# Patient Record
Sex: Female | Born: 1990 | Race: White | Hispanic: No | Marital: Married | Smoking: Never smoker
Health system: Southern US, Community
[De-identification: ages and names within clinical notes are randomized; demographics above are authoritative.]

## PROBLEM LIST (undated history)

## (undated) ENCOUNTER — Inpatient Hospital Stay (HOSPITAL_COMMUNITY): Payer: Self-pay

## (undated) DIAGNOSIS — K649 Unspecified hemorrhoids: Secondary | ICD-10-CM

## (undated) DIAGNOSIS — R112 Nausea with vomiting, unspecified: Secondary | ICD-10-CM

## (undated) DIAGNOSIS — D649 Anemia, unspecified: Secondary | ICD-10-CM

## (undated) DIAGNOSIS — N390 Urinary tract infection, site not specified: Secondary | ICD-10-CM

## (undated) DIAGNOSIS — Z9889 Other specified postprocedural states: Secondary | ICD-10-CM

## (undated) HISTORY — PX: APPENDECTOMY: SHX54

## (undated) HISTORY — PX: TONSILLECTOMY: SUR1361

## (undated) HISTORY — DX: Anemia, unspecified: D64.9

## (undated) HISTORY — PX: BREAST SURGERY: SHX581

## (undated) HISTORY — PX: WISDOM TOOTH EXTRACTION: SHX21

---

## 2013-02-22 ENCOUNTER — Emergency Department (HOSPITAL_COMMUNITY)
Admission: EM | Admit: 2013-02-22 | Discharge: 2013-02-22 | Disposition: A | Discharge status: Home / Self Care | Payer: BC Managed Care – PPO | Attending: Emergency Medicine | Admitting: Emergency Medicine

## 2013-02-22 ENCOUNTER — Encounter (HOSPITAL_COMMUNITY): Payer: Self-pay | Admitting: *Deleted

## 2013-02-22 DIAGNOSIS — R197 Diarrhea, unspecified: Secondary | ICD-10-CM

## 2013-02-22 DIAGNOSIS — Z3202 Encounter for pregnancy test, result negative: Secondary | ICD-10-CM | POA: Insufficient documentation

## 2013-02-22 DIAGNOSIS — R110 Nausea: Secondary | ICD-10-CM | POA: Insufficient documentation

## 2013-02-22 DIAGNOSIS — K6289 Other specified diseases of anus and rectum: Secondary | ICD-10-CM | POA: Insufficient documentation

## 2013-02-22 DIAGNOSIS — K625 Hemorrhage of anus and rectum: Secondary | ICD-10-CM

## 2013-02-22 DIAGNOSIS — Z8679 Personal history of other diseases of the circulatory system: Secondary | ICD-10-CM | POA: Insufficient documentation

## 2013-02-22 LAB — COMPREHENSIVE METABOLIC PANEL
ALT: 8 U/L (ref 0–35)
Alkaline Phosphatase: 53 U/L (ref 39–117)
BUN: 11 mg/dL (ref 6–23)
CO2: 26 mEq/L (ref 19–32)
Chloride: 104 mEq/L (ref 96–112)
GFR calc Af Amer: >90 mL/min (ref >90)
GFR calc non Af Amer: >90 mL/min (ref >90)
Glucose, Bld: 113 mg/dL — ABNORMAL HIGH (ref 70–99)
Potassium: 3.9 mEq/L (ref 3.5–5.1)
Sodium: 137 mEq/L (ref 135–145)
Total Bilirubin: 0.3 mg/dL (ref 0.3–1.2)

## 2013-02-22 LAB — CBC WITH DIFFERENTIAL/PLATELET
Eosinophils Absolute: 0.1 10*3/uL (ref 0.0–0.7)
Hemoglobin: 12.2 g/dL (ref 12.0–15.0)
Lymphocytes Relative: 23 % (ref 12–46)
Lymphs Abs: 2 10*3/uL (ref 0.7–4.0)
MCH: 29.2 pg (ref 26.0–34.0)
Monocytes Relative: 8 % (ref 3–12)
Neutrophils Relative %: 68 % (ref 43–77)
Platelets: 239 10*3/uL (ref 150–400)
RBC: 4.18 MIL/uL (ref 3.87–5.11)
WBC: 9.1 10*3/uL (ref 4.0–10.5)

## 2013-02-22 LAB — URINALYSIS, ROUTINE W REFLEX MICROSCOPIC
Bilirubin Urine: NEGATIVE
Ketones, ur: NEGATIVE mg/dL
Leukocytes, UA: NEGATIVE
Nitrite: NEGATIVE
Protein, ur: NEGATIVE mg/dL
Urobilinogen, UA: 0.2 mg/dL (ref 0.0–1.0)
pH: 6.5 (ref 5.0–8.0)

## 2013-02-22 LAB — OCCULT BLOOD, POC DEVICE: Fecal Occult Bld: POSITIVE — AB

## 2013-02-22 MED ORDER — ONDANSETRON 4 MG PO TBDP
8.0000 mg | ORAL_TABLET | Freq: Once | ORAL | Status: AC
  Administered 2013-02-22: 8 mg via ORAL
  Filled 2013-02-22: qty 2

## 2013-02-22 MED ORDER — HYDROCODONE-ACETAMINOPHEN 5-325 MG PO TABS: 1.0000 | ORAL_TABLET | ORAL | Status: DC | PRN

## 2013-02-22 MED ORDER — ONDANSETRON HCL 8 MG PO TABS: 8.0000 mg | ORAL_TABLET | Freq: Three times a day (TID) | ORAL | Status: DC | PRN

## 2013-02-22 MED ORDER — HYDROCODONE-ACETAMINOPHEN 5-325 MG PO TABS
2.0000 | ORAL_TABLET | Freq: Once | ORAL | Status: AC
  Administered 2013-02-22: 2 via ORAL
  Filled 2013-02-22: qty 2

## 2013-02-22 NOTE — ED Provider Notes (Signed)
History    CSN: [id number] Arrival date & time 02/22/13  1935  None    Chief Complaint  Patient presents with  . Abdominal Pain   (Consider location/radiation/quality/duration/timing/severity/associated sxs/prior Treatment) HPI History provided by pt.   Pt developed diarrhea at 10pm yesterday.  Has had several episodes since.  Noticed bleeding from rectum the second or third episode and has seen large amounts of blood every time since.  Associated w/ severe cramping across lower abd that increases during bowel movement but is relieved briefly immediately following, as well as mild rectal pain and nausea.  Denies fever, lightheadedness, SOB, urinary and vaginal sx.  Pt prescribed cipro/flagyl and an urgent care today and has taken the first dose.  Has h/o hemorrhoids but is otherwise healthy.  No prior h/o hematochezia.  No recent travel or abx use.  Has not eaten at a restaurant recently.  No known sick contacts.  Is not anti-coagulated.  Paternal grandmother w/ breast cancer but no FH of colon cancer.  Denies trauma.    History reviewed. No pertinent past medical history. History reviewed. No pertinent past surgical history. No family history on file. History  Substance Use Topics  . Smoking status: Never Smoker   . Smokeless tobacco: Not on file  . Alcohol Use: No   OB History   Grav Para Term Preterm Abortions TAB SAB Ect Mult Living                 Review of Systems  All other systems reviewed and are negative.    Allergies  Review of patient's allergies indicates no known allergies.  Home Medications   Current Outpatient Rx  Name  Route  Sig  Dispense  Refill  . ciprofloxacin (CIPRO) 500 MG tablet   Oral   Take 500 mg by mouth 2 (two) times daily.         . norethindrone-ethinyl estradiol-iron (MICROGESTIN FE,GILDESS FE,LOESTRIN FE) 1.5-30 MG-MCG tablet   Oral   Take 1 tablet by mouth every evening.         Marland Kitchen HYDROcodone-acetaminophen (NORCO/VICODIN) 5-325  MG per tablet   Oral   Take 1 tablet by mouth every 4 (four) hours as needed for pain.   20 tablet   0   . ondansetron (ZOFRAN) 8 MG tablet   Oral   Take 1 tablet (8 mg total) by mouth every 8 (eight) hours as needed for nausea.   20 tablet   0    BP 127/85  Pulse 88  Temp(Src) 98.1 F (36.7 C) (Oral)  Resp 16  SpO2 100%  LMP 02/19/2013 Physical Exam  Nursing note and vitals reviewed. Constitutional: She is oriented to person, place, and time. She appears well-developed and well-nourished. No distress.  HENT:  Head: Normocephalic and atraumatic.  Mouth/Throat: Oropharynx is clear and moist.  Eyes: Conjunctivae are normal.  Normal appearance  Neck: Normal range of motion.  Cardiovascular: Normal rate and regular rhythm.   Pulmonary/Chest: Effort normal and breath sounds normal. No respiratory distress.  Abdominal: Soft. Bowel sounds are normal. She exhibits no distension and no mass. There is no rebound and no guarding.  Pt reports tenderness across lower abdomen but does not appear uncomfortable and continues to speak throughout my exam.  Genitourinary:  No CVA tenderness.  No external hemorrhoids.  Nml rectal tone.  No stool or gross blood on exam.  No rectal tenderness.   Musculoskeletal: Normal range of motion.  Neurological: She is alert and oriented  to person, place, and time.  Skin: Skin is warm and dry. No rash noted.  Psychiatric: She has a normal mood and affect. Her behavior is normal.    ED Course  Procedures (including critical care time) Labs Reviewed  CBC WITH DIFFERENTIAL - Abnormal; Notable for the following:    HCT 34.5 (*)    All other components within normal limits  COMPREHENSIVE METABOLIC PANEL - Abnormal; Notable for the following:    Glucose, Bld 113 (*)    All other components within normal limits  OCCULT BLOOD, POC DEVICE - Abnormal; Notable for the following:    Fecal Occult Bld POSITIVE (*)    All other components within normal limits   URINALYSIS, ROUTINE W REFLEX MICROSCOPIC  POCT PREGNANCY, URINE  POCT PREGNANCY, URINE   No results found. 1. Rectal bleeding   2. Diarrhea     MDM  22yo healthy F presents w/ diarrhea and rectal bleeding.  Was started on cipro/flagyl at an urgent care today.  On exam, afebrile, NAD, well-hydrated, abd benign, no gross blood in rectum.  Labs sig for nml hgb/hct and heme pos stool.  Pt received a dose of vicodin and sx resolved.  I prescribed same, recommended that she continue cipro/flagyl, not take any anti-diarrheals, and f/u with GI.  Importance of f/u and strict return precautions discussed.  Arie Sabina Ardelle Haliburton, PA-C 02/23/13 0000

## 2013-02-22 NOTE — ED Notes (Signed)
The pt is c/o abd pain and bloody diarrhea since yesterday.  She was seen at [facility] earlier today abd was given antibiotics but the pain continues and she is concerned that the bloody diarrrhea is still going  lmp July 15th

## 2013-02-22 NOTE — ED Notes (Signed)
Discharge instructions reviewed. Pt verbalized understanding.  

## 2013-02-23 NOTE — ED Provider Notes (Signed)
  Medical screening examination/treatment/procedure(s) were performed by non-physician practitioner and as supervising physician I was immediately available for consultation/collaboration.    Gerhard Munch, MD 02/23/13 [id number]

## 2013-08-30 ENCOUNTER — Ambulatory Visit
Admission: RE | Admit: 2013-08-30 | Discharge: 2013-08-30 | Disposition: A | Discharge status: Home / Self Care | Payer: 59 | Source: Ambulatory Visit | Attending: Family | Admitting: Family

## 2013-08-30 ENCOUNTER — Other Ambulatory Visit: Payer: Self-pay | Admitting: Family

## 2013-08-30 DIAGNOSIS — R509 Fever, unspecified: Secondary | ICD-10-CM

## 2013-08-30 DIAGNOSIS — R0689 Other abnormalities of breathing: Secondary | ICD-10-CM

## 2014-11-10 LAB — OB RESULTS CONSOLE ANTIBODY SCREEN: ANTIBODY SCREEN: NEGATIVE

## 2014-11-10 LAB — OB RESULTS CONSOLE ABO/RH: RH Type: POSITIVE

## 2015-01-04 ENCOUNTER — Encounter (HOSPITAL_COMMUNITY): Payer: Self-pay | Admitting: *Deleted

## 2015-01-04 ENCOUNTER — Inpatient Hospital Stay (HOSPITAL_COMMUNITY)
Admission: AD | Admit: 2015-01-04 | Discharge: 2015-01-04 | Disposition: A | Discharge status: Home / Self Care | Payer: 59 | Source: Ambulatory Visit | Attending: Obstetrics and Gynecology | Admitting: Obstetrics and Gynecology

## 2015-01-04 DIAGNOSIS — Z3A01 Less than 8 weeks gestation of pregnancy: Secondary | ICD-10-CM

## 2015-01-04 DIAGNOSIS — O219 Vomiting of pregnancy, unspecified: Secondary | ICD-10-CM | POA: Diagnosis not present

## 2015-01-04 DIAGNOSIS — O210 Mild hyperemesis gravidarum: Secondary | ICD-10-CM | POA: Diagnosis present

## 2015-01-04 HISTORY — DX: Urinary tract infection, site not specified: N39.0

## 2015-01-04 HISTORY — DX: Unspecified hemorrhoids: K64.9

## 2015-01-04 LAB — URINALYSIS, ROUTINE W REFLEX MICROSCOPIC
BILIRUBIN URINE: NEGATIVE
GLUCOSE, UA: NEGATIVE mg/dL
Hgb urine dipstick: NEGATIVE
Ketones, ur: 15 mg/dL — AB
Leukocytes, UA: NEGATIVE
Nitrite: NEGATIVE
PH: 7 (ref 5.0–8.0)
Protein, ur: NEGATIVE mg/dL
Specific Gravity, Urine: 1.015 (ref 1.005–1.030)
Urobilinogen, UA: 0.2 mg/dL (ref 0.0–1.0)

## 2015-01-04 LAB — POCT PREGNANCY, URINE: PREG TEST UR: POSITIVE — AB

## 2015-01-04 MED ORDER — LACTATED RINGERS IV BOLUS (SEPSIS)
1000.0000 mL | Freq: Once | INTRAVENOUS | Status: AC
  Administered 2015-01-04: 1000 mL via INTRAVENOUS

## 2015-01-04 MED ORDER — FAMOTIDINE IN NACL 20-0.9 MG/50ML-% IV SOLN
20.0000 mg | Freq: Once | INTRAVENOUS | Status: AC
  Administered 2015-01-04: 20 mg via INTRAVENOUS
  Filled 2015-01-04: qty 50

## 2015-01-04 MED ORDER — PROMETHAZINE HCL 12.5 MG PO TABS
12.5000 mg | ORAL_TABLET | Freq: Four times a day (QID) | ORAL | Status: DC | PRN
Start: 2015-01-04 — End: 2015-08-25

## 2015-01-04 MED ORDER — PROMETHAZINE HCL 25 MG/ML IJ SOLN
25.0000 mg | Freq: Once | INTRAMUSCULAR | Status: AC
  Administered 2015-01-04: 25 mg via INTRAVENOUS
  Filled 2015-01-04: qty 1

## 2015-01-04 NOTE — MAU Note (Signed)
Pos HPT on 5/7, has been seen in MD office with this pregnancy.  Vomiting started on 5/23, became much worse over the weekend, unable to hold anything down, has dry heaves as well.  Diarrhea yesterday, none today.  Abdominal pain while vomiting, no vaginal bleeding.  Throat feels raw.

## 2015-01-04 NOTE — MAU Provider Note (Signed)
History     CSN: [id number]  Arrival date and time: 01/04/15 1304   First Provider Initiated Contact with Patient 01/04/15 1347      Chief Complaint  Patient presents with  . Emesis During Pregnancy   HPI   Ms.Joy Pena is a 24 y.o. female G2P0010 at 4254w1d who presents to MAU with N/V that started on May 23rd; the vomiting occurs all day long. She tried apple sauce and water and vomited this up.  She has been taking diclegis and only took it for one day and stopped because she did like the way it made her feel and she could not tell a difference. She has not tried any other medications over the counter for prescription.   OB History    Gravida Para Term Preterm AB TAB SAB Ectopic Multiple Living   2    1  1          Past Medical History  Diagnosis Date  . UTI (lower urinary tract infection)   . Hemorrhoid     Past Surgical History  Procedure Laterality Date  . Tonsillectomy    . Appendectomy    . Breast surgery      Breast reduction  . Wisdom tooth extraction      History reviewed. No pertinent family history.  History  Substance Use Topics  . Smoking status: Never Smoker   . Smokeless tobacco: Not on file  . Alcohol Use: No    Allergies: No Known Allergies  Prescriptions prior to admission  Medication Sig Dispense Refill Last Dose  . Prenatal Vit-Fe Fumarate-FA (PRENATAL MULTIVITAMIN) TABS tablet Take 1 tablet by mouth daily at 12 noon.   01/01/2015  . ciprofloxacin (CIPRO) 500 MG tablet Take 500 mg by mouth 2 (two) times daily.   Completed Course at Unknown time  . HYDROcodone-acetaminophen (NORCO/VICODIN) 5-325 MG per tablet Take 1 tablet by mouth every 4 (four) hours as needed for pain. (Patient not taking: Reported on 01/04/2015) 20 tablet 0 Completed Course at Unknown time  . ondansetron (ZOFRAN) 8 MG tablet Take 1 tablet (8 mg total) by mouth every 8 (eight) hours as needed for nausea. (Patient not taking: Reported on 01/04/2015) 20 tablet 0    Results  for orders placed or performed during the hospital encounter of 01/04/15 (from the past 48 hour(s))  Urinalysis, Routine w reflex microscopic (not at [facility])     Status: Abnormal   Collection Time: 01/04/15  1:10 PM  Result Value Ref Range   Color, Urine YELLOW YELLOW   APPearance HAZY (A) CLEAR   Specific Gravity, Urine 1.015 1.005 - 1.030   pH 7.0 5.0 - 8.0   Glucose, UA NEGATIVE NEGATIVE mg/dL   Hgb urine dipstick NEGATIVE NEGATIVE   Bilirubin Urine NEGATIVE NEGATIVE   Ketones, ur 15 (A) NEGATIVE mg/dL   Protein, ur NEGATIVE NEGATIVE mg/dL   Urobilinogen, UA 0.2 0.0 - 1.0 mg/dL   Nitrite NEGATIVE NEGATIVE   Leukocytes, UA NEGATIVE NEGATIVE    Comment: MICROSCOPIC NOT DONE ON URINES WITH NEGATIVE PROTEIN, BLOOD, LEUKOCYTES, NITRITE, OR GLUCOSE <1000 mg/dL.  Pregnancy, urine POC     Status: Abnormal   Collection Time: 01/04/15  1:41 PM  Result Value Ref Range   Preg Test, Ur POSITIVE (A) NEGATIVE    Comment:        THE SENSITIVITY OF THIS METHODOLOGY IS >24 mIU/mL     Review of Systems  Constitutional: Negative for fever.  Gastrointestinal: Positive for nausea and  vomiting. Negative for diarrhea.  Neurological: Positive for dizziness (at times. ).   Physical Exam   Blood pressure 108/64, pulse 78, temperature 98.2 F (36.8 C), temperature source Oral, resp. rate 16.  Physical Exam  Constitutional: She is oriented to person, place, and time. She appears well-developed and well-nourished. No distress.  HENT:  Head: Normocephalic.  Eyes: Pupils are equal, round, and reactive to light.  Cardiovascular: Normal rate.   Respiratory: Effort normal and breath sounds normal.  GI: Soft. There is no tenderness.  Neurological: She is alert and oriented to person, place, and time.  Skin: Skin is warm. She is not diaphoretic. There is pallor.    MAU Course  Procedures  None  MDM  D5LR Phenergan 25 IV Pepcid IV  Patient tolerating PO fluids and crackers at this time   Discussed patient with Dr. Henderson Cloud   Assessment and Plan   A:  1. Nausea and vomiting in pregnancy prior to [redacted] weeks gestation    P:  Discharge home in stable condition Over the counter B6 100 mg BID Pepcid over the counter as directed; ok to take twice per day. RX: Phenergan  Return to MAU as needed, if symptoms worsen Small, frequent meals throughout the day     Duane Lope, NP 01/04/2015 1:58 PM

## 2015-01-04 NOTE — Discharge Instructions (Signed)

## 2015-02-27 LAB — OB RESULTS CONSOLE RPR: RPR: NONREACTIVE

## 2015-02-27 LAB — OB RESULTS CONSOLE RUBELLA ANTIBODY, IGM: RUBELLA: IMMUNE

## 2015-02-27 LAB — OB RESULTS CONSOLE HEPATITIS B SURFACE ANTIGEN: Hepatitis B Surface Ag: NEGATIVE

## 2015-02-27 LAB — OB RESULTS CONSOLE GC/CHLAMYDIA
CHLAMYDIA, DNA PROBE: NEGATIVE
GC PROBE AMP, GENITAL: NEGATIVE

## 2015-02-27 LAB — OB RESULTS CONSOLE HIV ANTIBODY (ROUTINE TESTING): HIV: NONREACTIVE

## 2015-04-15 ENCOUNTER — Inpatient Hospital Stay (HOSPITAL_COMMUNITY)
Admission: AD | Admit: 2015-04-15 | Discharge: 2015-04-15 | Disposition: A | Discharge status: Home / Self Care | Payer: 59 | Source: Ambulatory Visit | Attending: Obstetrics & Gynecology | Admitting: Obstetrics & Gynecology

## 2015-04-15 ENCOUNTER — Encounter (HOSPITAL_COMMUNITY): Payer: Self-pay | Admitting: *Deleted

## 2015-04-15 DIAGNOSIS — Z3A21 21 weeks gestation of pregnancy: Secondary | ICD-10-CM | POA: Diagnosis not present

## 2015-04-15 DIAGNOSIS — N949 Unspecified condition associated with female genital organs and menstrual cycle: Secondary | ICD-10-CM | POA: Diagnosis not present

## 2015-04-15 DIAGNOSIS — R109 Unspecified abdominal pain: Secondary | ICD-10-CM | POA: Diagnosis present

## 2015-04-15 DIAGNOSIS — R102 Pelvic and perineal pain: Secondary | ICD-10-CM | POA: Diagnosis not present

## 2015-04-15 DIAGNOSIS — O26892 Other specified pregnancy related conditions, second trimester: Secondary | ICD-10-CM | POA: Insufficient documentation

## 2015-04-15 LAB — URINALYSIS, ROUTINE W REFLEX MICROSCOPIC
Bilirubin Urine: NEGATIVE
GLUCOSE, UA: NEGATIVE mg/dL
Hgb urine dipstick: NEGATIVE
KETONES UR: NEGATIVE mg/dL
LEUKOCYTES UA: NEGATIVE
Nitrite: NEGATIVE
PH: 5.5 (ref 5.0–8.0)
Protein, ur: NEGATIVE mg/dL
Specific Gravity, Urine: <1.005 — ABNORMAL LOW (ref 1.005–1.030)
Urobilinogen, UA: 0.2 mg/dL (ref 0.0–1.0)

## 2015-04-15 NOTE — MAU Note (Signed)
Pain RLQ and into R side. Hurts worse with walking. Had pain once in past and better after BM. Had BM this afternoon and did not help. Denies LOF or bleeding.

## 2015-04-15 NOTE — MAU Provider Note (Signed)
  History     CSN: [id number]  Arrival date and time: 04/15/15 0050   None     Chief Complaint  Patient presents with  . Abdominal Pain   HPI  Joy Pena 24 y.o. [id number] [redacted]w[redacted]d presents with a sharp pain on her right side of her abdomen to her groin area when she walks. Denies vaginal bleeding, LOF, contractions. Reports positive fetal movement  Past Medical History  Diagnosis Date  . UTI (lower urinary tract infection)   . Hemorrhoid     Past Surgical History  Procedure Laterality Date  . Tonsillectomy    . Appendectomy    . Breast surgery      Breast reduction  . Wisdom tooth extraction      No family history on file.  Social History  Substance Use Topics  . Smoking status: Never Smoker   . Smokeless tobacco: Not on file  . Alcohol Use: No    Allergies: No Known Allergies  Prescriptions prior to admission  Medication Sig Dispense Refill Last Dose  . Prenatal Vit-Fe Fumarate-FA (PRENATAL MULTIVITAMIN) TABS tablet Take 1 tablet by mouth daily at 12 noon.   01/01/2015  . promethazine (PHENERGAN) 12.5 MG tablet Take 1 tablet (12.5 mg total) by mouth every 6 (six) hours as needed for nausea or vomiting. 30 tablet 0     Review of Systems  Constitutional: Negative for fever.  Gastrointestinal: Positive for abdominal pain.  All other systems reviewed and are negative.  Physical Exam   Blood pressure 115/65, pulse 79, temperature 98.1 F (36.7 C), resp. rate 18, height  (1.575 m), weight 60.691 kg (133 lb 12.8 oz).  Physical Exam  Nursing note and vitals reviewed. Constitutional: She is oriented to person, place, and time. She appears well-developed and well-nourished. No distress.  Cardiovascular: Normal rate.   Respiratory: Effort normal. No respiratory distress.  GI: Soft. There is no tenderness.  Neurological: She is alert and oriented to person, place, and time.  Skin: Skin is warm and dry.  Psychiatric: She has a normal mood and affect. Her  behavior is normal. Judgment and thought content normal.   Results for orders placed or performed during the hospital encounter of 04/15/15 (from the past 24 hour(s))  Urinalysis, Routine w reflex microscopic (not at [facility])     Status: Abnormal   Collection Time: 04/15/15  1:10 AM  Result Value Ref Range   Color, Urine YELLOW YELLOW   APPearance CLEAR CLEAR   Specific Gravity, Urine <1.005 (L) 1.005 - 1.030   pH 5.5 5.0 - 8.0   Glucose, UA NEGATIVE NEGATIVE mg/dL   Hgb urine dipstick NEGATIVE NEGATIVE   Bilirubin Urine NEGATIVE NEGATIVE   Ketones, ur NEGATIVE NEGATIVE mg/dL   Protein, ur NEGATIVE NEGATIVE mg/dL   Urobilinogen, UA 0.2 0.0 - 1.0 mg/dL   Nitrite NEGATIVE NEGATIVE   Leukocytes, UA NEGATIVE NEGATIVE   MAU Course  Procedures  MDM POC discussed with Dr. Mora Appl; Pt will be instructed to take tylenol and warm bath. Will be discharged to home  Assessment and Plan  Round Ligament Pain Discharge to home   [facility] [place] 04/15/2015, 1:28 AM

## 2015-04-15 NOTE — Discharge Instructions (Signed)

## 2015-04-15 NOTE — Progress Notes (Signed)
Lori Clemmons CNM in to discuss d/c plan. Written and verbal d/c instructions given and understanding voiced.

## 2015-06-05 ENCOUNTER — Inpatient Hospital Stay (HOSPITAL_COMMUNITY)
Admission: AD | Admit: 2015-06-05 | Discharge: 2015-06-05 | Disposition: A | Discharge status: Home / Self Care | Payer: 59 | Source: Ambulatory Visit | Attending: Obstetrics | Admitting: Obstetrics

## 2015-06-05 ENCOUNTER — Encounter (HOSPITAL_COMMUNITY): Payer: Self-pay | Admitting: *Deleted

## 2015-06-05 DIAGNOSIS — O368130 Decreased fetal movements, third trimester, not applicable or unspecified: Secondary | ICD-10-CM | POA: Insufficient documentation

## 2015-06-05 DIAGNOSIS — O368131 Decreased fetal movements, third trimester, fetus 1: Secondary | ICD-10-CM

## 2015-06-05 DIAGNOSIS — Z3A28 28 weeks gestation of pregnancy: Secondary | ICD-10-CM | POA: Diagnosis not present

## 2015-06-05 LAB — URINALYSIS, ROUTINE W REFLEX MICROSCOPIC
Bilirubin Urine: NEGATIVE
GLUCOSE, UA: NEGATIVE mg/dL
Hgb urine dipstick: NEGATIVE
KETONES UR: NEGATIVE mg/dL
LEUKOCYTES UA: NEGATIVE
NITRITE: NEGATIVE
PH: 5.5 (ref 5.0–8.0)
Protein, ur: NEGATIVE mg/dL
Urobilinogen, UA: 0.2 mg/dL (ref 0.0–1.0)

## 2015-06-05 NOTE — MAU Note (Signed)
Pt c/o decreased in FM today-has felt some movement but not as much. Having some lower abdominal pain and pressure that started today. Denies vag bleeding or discharge.

## 2015-06-05 NOTE — MAU Provider Note (Signed)
History     CSN: [id number]  Arrival date and time: 06/05/15 2140   None     No chief complaint on file.  HPI  Pt is [redacted]w[redacted]d pregnant G2P0010 which has noticed decreased fetal movement today. Pt got up at 3 pm and felt a little moement and fell back asleep before work at 6 pm and had not felt anything and had smoothie at 7:45pm and didn't feel any movement until when pt came to MAU and started feeling baby move.  Pt denies loss of fluid, spotting/bleeding or contractions. RN note:  Editor: Brand Males, RN (Registered Nurse)     Expand All Collapse All   Pt c/o decreased in FM today-has felt some movement but not as much. Having some lower abdominal pain and pressure that started today. Denies vag bleeding or discharge.           Past Medical History  Diagnosis Date  . UTI (lower urinary tract infection)   . Hemorrhoid   . Yeast infection of the vagina     Past Surgical History  Procedure Laterality Date  . Tonsillectomy    . Appendectomy    . Breast surgery      Breast reduction  . Wisdom tooth extraction      Family History  Problem Relation Age of Onset  . Cancer Maternal Grandfather     Social History  Substance Use Topics  . Smoking status: Never Smoker   . Smokeless tobacco: None  . Alcohol Use: No     Comment: not while pregnant    Allergies: No Known Allergies  Prescriptions prior to admission  Medication Sig Dispense Refill Last Dose  . acetaminophen (TYLENOL) 325 MG tablet Take 650 mg by mouth every 6 (six) hours as needed for moderate pain.   Past Week at Unknown time  . famotidine (PEPCID) 20 MG tablet Take 20 mg by mouth 2 (two) times daily as needed for heartburn or indigestion.   Past Week at Unknown time  . ferrous sulfate 325 (65 FE) MG tablet Take 325 mg by mouth daily with breakfast.   06/05/2015 at 0400  . Prenatal Vit-Fe Fumarate-FA (PRENATAL MULTIVITAMIN) TABS tablet Take 1 tablet by mouth daily at 12 noon.   06/05/2015 at  0400  . promethazine (PHENERGAN) 12.5 MG tablet Take 1 tablet (12.5 mg total) by mouth every 6 (six) hours as needed for nausea or vomiting. (Patient not taking: Reported on 06/05/2015) 30 tablet 0 Not Taking at Unknown time    Review of Systems  Constitutional: Negative for fever and chills.  Gastrointestinal: Negative for heartburn, nausea, vomiting, abdominal pain, diarrhea and constipation.  Genitourinary: Negative for dysuria and urgency.  Neurological: Negative for dizziness and headaches.   Physical Exam   Blood pressure 105/65, pulse 91, temperature 98.4 F (36.9 C), temperature source Oral, resp. rate 20, height 5' 1.9" (1.572 m), weight 145 lb 3.2 oz (65.862 kg), SpO2 97 %.  Physical Exam  Nursing note and vitals reviewed. Constitutional: She is oriented to person, place, and time. She appears well-developed and well-nourished. No distress.  HENT:  Head: Normocephalic.  Eyes: Pupils are equal, round, and reactive to light.  Neck: Normal range of motion. Neck supple.  Cardiovascular: Normal rate.   Respiratory: Effort normal.  GI: Soft. She exhibits no distension. There is no tenderness. There is no rebound.  Musculoskeletal: Normal range of motion.  Neurological: She is alert and oriented to person, place, and time.  Skin: Skin is  warm and dry.  Psychiatric: She has a normal mood and affect.    MAU Course  Procedures Results for orders placed or performed during the hospital encounter of 06/05/15 (from the past 24 hour(s))  Urinalysis, Routine w reflex microscopic (not at [facility])     Status: Abnormal   Collection Time: 06/05/15  9:55 PM  Result Value Ref Range   Color, Urine YELLOW YELLOW   APPearance CLEAR CLEAR   Specific Gravity, Urine <1.005 (L) 1.005 - 1.030   pH 5.5 5.0 - 8.0   Glucose, UA NEGATIVE NEGATIVE mg/dL   Hgb urine dipstick NEGATIVE NEGATIVE   Bilirubin Urine NEGATIVE NEGATIVE   Ketones, ur NEGATIVE NEGATIVE mg/dL   Protein, ur NEGATIVE NEGATIVE  mg/dL   Urobilinogen, UA 0.2 0.0 - 1.0 mg/dL   Nitrite NEGATIVE NEGATIVE   Leukocytes, UA NEGATIVE NEGATIVE   Discussed with Dr. Chestine Sporelark  Assessment and Plan  Decreased fetal movement- kick counts reviewed with pt Third trimester Keep OB appointment- return sooner for any concerns  Ajdin Macke 06/05/2015, 10:46 PM

## 2015-07-24 ENCOUNTER — Other Ambulatory Visit: Payer: Self-pay | Admitting: Obstetrics and Gynecology

## 2015-07-24 LAB — OB RESULTS CONSOLE GBS: STREP GROUP B AG: NEGATIVE

## 2015-08-06 NOTE — L&D Delivery Note (Signed)
Patient was C/C/+3 and pushed for 20 minutes with epidural.   NSVD  female infant, Apgars 9,9, weight P.   The patient had a second degree episiotomy at pt's request to expedite delivery repaired with 2-0 vicryl R. Fundus was firm. EBL was expected amount. Placenta was delivered intact. Vagina was clear.  Baby was vigorous and doing skin to skin with mother.  Joy Pena A

## 2015-08-25 ENCOUNTER — Inpatient Hospital Stay (HOSPITAL_COMMUNITY)
Admission: AD | Admit: 2015-08-25 | Discharge: 2015-08-25 | Disposition: A | Discharge status: Home / Self Care | Payer: 59 | Source: Ambulatory Visit | Attending: Obstetrics | Admitting: Obstetrics

## 2015-08-25 ENCOUNTER — Inpatient Hospital Stay (HOSPITAL_COMMUNITY): Payer: 59 | Admitting: Anesthesiology

## 2015-08-25 ENCOUNTER — Encounter (HOSPITAL_COMMUNITY): Payer: Self-pay

## 2015-08-25 ENCOUNTER — Inpatient Hospital Stay (HOSPITAL_COMMUNITY)
Admission: AD | Admit: 2015-08-25 | Discharge: 2015-08-27 | DRG: 775 | Disposition: A | Discharge status: Home / Self Care | Payer: 59 | Source: Ambulatory Visit | Attending: Obstetrics and Gynecology | Admitting: Obstetrics and Gynecology

## 2015-08-25 DIAGNOSIS — Z3A40 40 weeks gestation of pregnancy: Secondary | ICD-10-CM

## 2015-08-25 DIAGNOSIS — IMO0001 Reserved for inherently not codable concepts without codable children: Secondary | ICD-10-CM

## 2015-08-25 HISTORY — DX: Nausea with vomiting, unspecified: R11.2

## 2015-08-25 HISTORY — DX: Nausea with vomiting, unspecified: Z98.890

## 2015-08-25 LAB — CBC
HEMATOCRIT: 30.1 % — AB (ref 36.0–46.0)
HEMOGLOBIN: 10.6 g/dL — AB (ref 12.0–15.0)
MCH: 30.3 pg (ref 26.0–34.0)
MCHC: 35.2 g/dL (ref 30.0–36.0)
MCV: 86 fL (ref 78.0–100.0)
Platelets: 179 10*3/uL (ref 150–400)
RBC: 3.5 MIL/uL — AB (ref 3.87–5.11)
RDW: 15.2 % (ref 11.5–15.5)
WBC: 13.3 10*3/uL — ABNORMAL HIGH (ref 4.0–10.5)

## 2015-08-25 LAB — TYPE AND SCREEN
ABO/RH(D): O POS
ANTIBODY SCREEN: NEGATIVE

## 2015-08-25 LAB — ABO/RH: ABO/RH(D): O POS

## 2015-08-25 MED ORDER — SODIUM CHLORIDE 0.9 % IJ SOLN: 3.0000 mL | INTRAMUSCULAR | Status: DC | PRN

## 2015-08-25 MED ORDER — METHYLERGONOVINE MALEATE 0.2 MG/ML IJ SOLN: 0.2000 mg | INTRAMUSCULAR | Status: DC | PRN

## 2015-08-25 MED ORDER — LACTATED RINGERS IV SOLN: 2.5000 [IU]/h | INTRAVENOUS | Status: DC

## 2015-08-25 MED ORDER — TETANUS-DIPHTH-ACELL PERTUSSIS 5-2.5-18.5 LF-MCG/0.5 IM SUSP: 0.5000 mL | Freq: Once | INTRAMUSCULAR | Status: DC

## 2015-08-25 MED ORDER — ONDANSETRON HCL 4 MG PO TABS: 4.0000 mg | ORAL_TABLET | ORAL | Status: DC | PRN

## 2015-08-25 MED ORDER — OXYCODONE-ACETAMINOPHEN 5-325 MG PO TABS: 2.0000 | ORAL_TABLET | ORAL | Status: DC | PRN

## 2015-08-25 MED ORDER — ACETAMINOPHEN 325 MG PO TABS: 650.0000 mg | ORAL_TABLET | ORAL | Status: DC | PRN

## 2015-08-25 MED ORDER — SODIUM CHLORIDE 0.9 % IV SOLN: 250.0000 mL | INTRAVENOUS | Status: DC | PRN

## 2015-08-25 MED ORDER — SIMETHICONE 80 MG PO CHEW: 80.0000 mg | CHEWABLE_TABLET | ORAL | Status: DC | PRN

## 2015-08-25 MED ORDER — LIDOCAINE HCL (PF) 1 % IJ SOLN
INTRAMUSCULAR | Status: DC | PRN
  Administered 2015-08-25: 6 mL via EPIDURAL
  Administered 2015-08-25: 4 mL

## 2015-08-25 MED ORDER — ONDANSETRON HCL 4 MG/2ML IJ SOLN: 4.0000 mg | INTRAMUSCULAR | Status: DC | PRN

## 2015-08-25 MED ORDER — SODIUM CHLORIDE 0.9 % IJ SOLN: 3.0000 mL | Freq: Two times a day (BID) | INTRAMUSCULAR | Status: DC

## 2015-08-25 MED ORDER — FLEET ENEMA 7-19 GM/118ML RE ENEM: 1.0000 | ENEMA | RECTAL | Status: DC | PRN

## 2015-08-25 MED ORDER — METHYLERGONOVINE MALEATE 0.2 MG PO TABS: 0.2000 mg | ORAL_TABLET | ORAL | Status: DC | PRN

## 2015-08-25 MED ORDER — CITRIC ACID-SODIUM CITRATE 334-500 MG/5ML PO SOLN: 30.0000 mL | ORAL | Status: DC | PRN

## 2015-08-25 MED ORDER — SENNOSIDES-DOCUSATE SODIUM 8.6-50 MG PO TABS
2.0000 | ORAL_TABLET | ORAL | Status: DC
  Administered 2015-08-27: 2 via ORAL
  Filled 2015-08-25: qty 2

## 2015-08-25 MED ORDER — PHENYLEPHRINE 40 MCG/ML (10ML) SYRINGE FOR IV PUSH (FOR BLOOD PRESSURE SUPPORT)
80.0000 ug | PREFILLED_SYRINGE | INTRAVENOUS | Status: DC | PRN
  Filled 2015-08-25: qty 20
  Filled 2015-08-25: qty 2

## 2015-08-25 MED ORDER — BENZOCAINE-MENTHOL 20-0.5 % EX AERO
1.0000 "application " | INHALATION_SPRAY | CUTANEOUS | Status: DC | PRN
  Filled 2015-08-25: qty 56

## 2015-08-25 MED ORDER — LIDOCAINE HCL (PF) 1 % IJ SOLN
30.0000 mL | INTRAMUSCULAR | Status: DC | PRN
  Administered 2015-08-25: 30 mL via SUBCUTANEOUS
  Filled 2015-08-25: qty 30

## 2015-08-25 MED ORDER — OXYCODONE-ACETAMINOPHEN 5-325 MG PO TABS: 1.0000 | ORAL_TABLET | ORAL | Status: DC | PRN

## 2015-08-25 MED ORDER — OXYTOCIN BOLUS FROM INFUSION
500.0000 mL | INTRAVENOUS | Status: DC
  Administered 2015-08-25: 500 mL via INTRAVENOUS

## 2015-08-25 MED ORDER — LACTATED RINGERS IV SOLN
500.0000 mL | INTRAVENOUS | Status: DC | PRN
  Administered 2015-08-25: 500 mL via INTRAVENOUS

## 2015-08-25 MED ORDER — MAGNESIUM HYDROXIDE 400 MG/5ML PO SUSP: 30.0000 mL | ORAL | Status: DC | PRN

## 2015-08-25 MED ORDER — ZOLPIDEM TARTRATE 5 MG PO TABS: 5.0000 mg | ORAL_TABLET | Freq: Every evening | ORAL | Status: DC | PRN

## 2015-08-25 MED ORDER — IBUPROFEN 800 MG PO TABS
800.0000 mg | ORAL_TABLET | Freq: Three times a day (TID) | ORAL | Status: DC
  Administered 2015-08-25 – 2015-08-27 (×5): 800 mg via ORAL
  Filled 2015-08-25 (×5): qty 1

## 2015-08-25 MED ORDER — TERBUTALINE SULFATE 1 MG/ML IJ SOLN
0.2500 mg | Freq: Once | INTRAMUSCULAR | Status: DC | PRN
  Filled 2015-08-25: qty 1

## 2015-08-25 MED ORDER — DIBUCAINE 1 % RE OINT
1.0000 "application " | TOPICAL_OINTMENT | RECTAL | Status: DC | PRN
  Filled 2015-08-25: qty 28

## 2015-08-25 MED ORDER — DIPHENHYDRAMINE HCL 50 MG/ML IJ SOLN: 12.5000 mg | INTRAMUSCULAR | Status: DC | PRN

## 2015-08-25 MED ORDER — FAMOTIDINE 20 MG PO TABS: 20.0000 mg | ORAL_TABLET | Freq: Two times a day (BID) | ORAL | Status: DC | PRN

## 2015-08-25 MED ORDER — MEASLES, MUMPS & RUBELLA VAC ~~LOC~~ INJ
0.5000 mL | INJECTION | Freq: Once | SUBCUTANEOUS | Status: DC
  Filled 2015-08-25: qty 0.5

## 2015-08-25 MED ORDER — ONDANSETRON HCL 4 MG/2ML IJ SOLN: 4.0000 mg | Freq: Four times a day (QID) | INTRAMUSCULAR | Status: DC | PRN

## 2015-08-25 MED ORDER — LANOLIN HYDROUS EX OINT: TOPICAL_OINTMENT | CUTANEOUS | Status: DC | PRN

## 2015-08-25 MED ORDER — LACTATED RINGERS IV SOLN
INTRAVENOUS | Status: DC
  Administered 2015-08-25 (×2): via INTRAVENOUS

## 2015-08-25 MED ORDER — FENTANYL 2.5 MCG/ML BUPIVACAINE 1/10 % EPIDURAL INFUSION (WH - ANES)
14.0000 mL/h | INTRAMUSCULAR | Status: DC | PRN
  Administered 2015-08-25: 14 mL/h via EPIDURAL
  Filled 2015-08-25: qty 125

## 2015-08-25 MED ORDER — DIPHENHYDRAMINE HCL 25 MG PO CAPS: 25.0000 mg | ORAL_CAPSULE | Freq: Four times a day (QID) | ORAL | Status: DC | PRN

## 2015-08-25 MED ORDER — PRENATAL MULTIVITAMIN CH
1.0000 | ORAL_TABLET | Freq: Every day | ORAL | Status: DC
  Administered 2015-08-26: 1 via ORAL
  Filled 2015-08-25: qty 1

## 2015-08-25 MED ORDER — EPHEDRINE 5 MG/ML INJ
10.0000 mg | INTRAVENOUS | Status: DC | PRN
  Filled 2015-08-25: qty 2

## 2015-08-25 MED ORDER — WITCH HAZEL-GLYCERIN EX PADS: 1.0000 "application " | MEDICATED_PAD | CUTANEOUS | Status: DC | PRN

## 2015-08-25 MED ORDER — BUTORPHANOL TARTRATE 1 MG/ML IJ SOLN
1.0000 mg | INTRAMUSCULAR | Status: DC | PRN
  Administered 2015-08-25: 1 mg via INTRAVENOUS
  Filled 2015-08-25: qty 1

## 2015-08-25 MED ORDER — FERROUS SULFATE 325 (65 FE) MG PO TABS
325.0000 mg | ORAL_TABLET | Freq: Two times a day (BID) | ORAL | Status: DC
  Administered 2015-08-26 – 2015-08-27 (×3): 325 mg via ORAL
  Filled 2015-08-25 (×3): qty 1

## 2015-08-25 MED ORDER — OXYTOCIN 10 UNIT/ML IJ SOLN
1.0000 m[IU]/min | INTRAVENOUS | Status: DC
  Administered 2015-08-25: 2 m[IU]/min via INTRAVENOUS
  Filled 2015-08-25: qty 4

## 2015-08-25 NOTE — Anesthesia Procedure Notes (Signed)
Epidural Patient location during procedure: OB  Preanesthetic Checklist Completed: patient identified, site marked, surgical consent, pre-op evaluation, timeout performed, IV checked, risks and benefits discussed and monitors and equipment checked  Epidural Patient position: sitting Prep: site prepped and draped and DuraPrep Patient monitoring: continuous pulse ox and blood pressure Approach: midline Location: L3-L4 Injection technique: LOR air  Needle:  Needle type: Tuohy  Needle gauge: 17 G Needle length: 9 cm and 9 Needle insertion depth: 4 cm Catheter type: closed end flexible Catheter size: 19 Gauge Catheter at skin depth: 10 cm Test dose: negative  Assessment Events: blood not aspirated, injection not painful, no injection resistance, negative IV test and no paresthesia  Additional Notes Dosing of Epidural:  1st dose, through catheter .............................................  Xylocaine 40 mg  2nd dose, through catheter, after waiting 3 minutes.........Xylocaine 60 mg    As each dose occurred, patient was free of IV sx; and patient exhibited no evidence of SA injection.  Patient is more comfortable after epidural dosed. Please see RN's note for documentation of vital signs,and FHR which are stable.  Patient reminded not to try to ambulate with numb legs, and that an RN must be present when she attempts to get up.       

## 2015-08-25 NOTE — MAU Note (Signed)
Pt presents for worsening contractions.

## 2015-08-25 NOTE — H&P (Signed)
25 y.o. [redacted]w[redacted]d  G2P0010 comes in c/o labor.  Otherwise has good fetal movement and no bleeding.  Past Medical History  Diagnosis Date  . UTI (lower urinary tract infection)   . Hemorrhoid   . Yeast infection of the vagina   . PONV (postoperative nausea and vomiting)     Past Surgical History  Procedure Laterality Date  . Tonsillectomy    . Appendectomy    . Breast surgery      Breast reduction  . Wisdom tooth extraction      OB History  Gravida Para Term Preterm AB SAB TAB Ectopic Multiple Living  # Outcome Date GA Lbr Len/2nd Weight Sex Delivery Anes PTL Lv  2 Current           1 SAB               Social History   Social History  . Marital Status: Married    Spouse Name: N/A  . Number of Children: N/A  . Years of Education: N/A   Occupational History  . Not on file.   Social History Main Topics  . Smoking status: Never Smoker   . Smokeless tobacco: Not on file  . Alcohol Use: No     Comment: not while pregnant  . Drug Use: No  . Sexual Activity: Yes   Other Topics Concern  . Not on file   Social History Narrative   Review of patient's allergies indicates no known allergies.    Prenatal Transfer Tool  Maternal Diabetes: No Genetic Screening: Normal Maternal Ultrasounds/Referrals: Normal Fetal Ultrasounds or other Referrals:  None Maternal Substance Abuse:  No Significant Maternal Medications:  None Significant Maternal Lab Results: None  Other PNC: uncomplicated.    Filed Vitals:   08/25/15 1319 08/25/15 1438  BP: 118/72 123/74  Pulse: 81 78  Temp:  98.9 F (37.2 C)  Resp: 20 20     Lungs/Cor:  NAD Abdomen:  soft, gravid Ex:  no cords, erythema SVE:  2/80/-2 FHTs:  110, good STV, NST R, mild variables Toco:  q 10   A/P   Term labor.  GBS neg.  Joy Pena A

## 2015-08-25 NOTE — Discharge Instructions (Signed)
Third Trimester of Pregnancy °The third trimester is from week 29 through week 42, months 7 through 9. The third trimester is a time when the fetus is growing rapidly. At the end of the ninth month, the fetus is about 20 inches in length and weighs 6-10 pounds.  °BODY CHANGES °Your body goes through many changes during pregnancy. The changes vary from woman to woman.  °· Your weight will continue to increase. You can expect to gain 25-35 pounds (11-16 kg) by the end of the pregnancy. °· You may begin to get stretch marks on your hips, abdomen, and breasts. °· You may urinate more often because the fetus is moving lower into your pelvis and pressing on your bladder. °· You may develop or continue to have heartburn as a result of your pregnancy. °· You may develop constipation because certain hormones are causing the muscles that push waste through your intestines to slow down. °· You may develop hemorrhoids or swollen, bulging veins (varicose veins). °· You may have pelvic pain because of the weight gain and pregnancy hormones relaxing your joints between the bones in your pelvis. Backaches may result from overexertion of the muscles supporting your posture. °· You may have changes in your hair. These can include thickening of your hair, rapid growth, and changes in texture. Some women also have hair loss during or after pregnancy, or hair that feels dry or thin. Your hair will most likely return to normal after your baby is born. °· Your breasts will continue to grow and be tender. A yellow discharge may leak from your breasts called colostrum. °· Your belly button may stick out. °· You may feel short of breath because of your expanding uterus. °· You may notice the fetus "dropping," or moving lower in your abdomen. °· You may have a bloody mucus discharge. This usually occurs a few days to a week before labor begins. °· Your cervix becomes thin and soft (effaced) near your due date. °WHAT TO EXPECT AT YOUR PRENATAL  EXAMS  °You will have prenatal exams every 2 weeks until week 36. Then, you will have weekly prenatal exams. During a routine prenatal visit: °· You will be weighed to make sure you and the fetus are growing normally. °· Your blood pressure is taken. °· Your abdomen will be measured to track your baby's growth. °· The fetal heartbeat will be listened to. °· Any test results from the previous visit will be discussed. °· You may have a cervical check near your due date to see if you have effaced. °At around 36 weeks, your caregiver will check your cervix. At the same time, your caregiver will also perform a test on the secretions of the vaginal tissue. This test is to determine if a type of bacteria, Group B streptococcus, is present. Your caregiver will explain this further. °Your caregiver may ask you: °· What your birth plan is. °· How you are feeling. °· If you are feeling the baby move. °· If you have had any abnormal symptoms, such as leaking fluid, bleeding, severe headaches, or abdominal cramping. °· If you are using any tobacco products, including cigarettes, chewing tobacco, and electronic cigarettes. °· If you have any questions. °Other tests or screenings that may be performed during your third trimester include: °· Blood tests that check for low iron levels (anemia). °· Fetal testing to check the health, activity level, and growth of the fetus. Testing is done if you have certain medical conditions or if   there are problems during the pregnancy. °· HIV (human immunodeficiency virus) testing. If you are at high risk, you may be screened for HIV during your third trimester of pregnancy. °FALSE LABOR °You may feel small, irregular contractions that eventually go away. These are called Braxton Hicks contractions, or false labor. Contractions may last for hours, days, or even weeks before true labor sets in. If contractions come at regular intervals, intensify, or become painful, it is best to be seen by your  caregiver.  °SIGNS OF LABOR  °· Menstrual-like cramps. °· Contractions that are 5 minutes apart or less. °· Contractions that start on the top of the uterus and spread down to the lower abdomen and back. °· A sense of increased pelvic pressure or back pain. °· A watery or bloody mucus discharge that comes from the vagina. °If you have any of these signs before the 37th week of pregnancy, call your caregiver right away. You need to go to the hospital to get checked immediately. °HOME CARE INSTRUCTIONS  °· Avoid all smoking, herbs, alcohol, and unprescribed drugs. These chemicals affect the formation and growth of the baby. °· Do not use any tobacco products, including cigarettes, chewing tobacco, and electronic cigarettes. If you need help quitting, ask your health care provider. You may receive counseling support and other resources to help you quit. °· Follow your caregiver's instructions regarding medicine use. There are medicines that are either safe or unsafe to take during pregnancy. °· Exercise only as directed by your caregiver. Experiencing uterine cramps is a good sign to stop exercising. °· Continue to eat regular, healthy meals. °· Wear a good support bra for breast tenderness. °· Do not use hot tubs, steam rooms, or saunas. °· Wear your seat belt at all times when driving. °· Avoid raw meat, uncooked cheese, cat litter boxes, and soil used by cats. These carry germs that can cause birth defects in the baby. °· Take your prenatal vitamins. °· Take 1500-2000 mg of calcium daily starting at the 20th week of pregnancy until you deliver your baby. °· Try taking a stool softener (if your caregiver approves) if you develop constipation. Eat more high-fiber foods, such as fresh vegetables or fruit and whole grains. Drink plenty of fluids to keep your urine clear or pale yellow. °· Take warm sitz baths to soothe any pain or discomfort caused by hemorrhoids. Use hemorrhoid cream if your caregiver approves. °· If  you develop varicose veins, wear support hose. Elevate your feet for 15 minutes, 3-4 times a day. Limit salt in your diet. °· Avoid heavy lifting, wear low heal shoes, and practice good posture. °· Rest a lot with your legs elevated if you have leg cramps or low back pain. °· Visit your dentist if you have not gone during your pregnancy. Use a soft toothbrush to brush your teeth and be gentle when you floss. °· A sexual relationship may be continued unless your caregiver directs you otherwise. °· Do not travel far distances unless it is absolutely necessary and only with the approval of your caregiver. °· Take prenatal classes to understand, practice, and ask questions about the labor and delivery. °· Make a trial run to the hospital. °· Pack your hospital bag. °· Prepare the baby's nursery. °· Continue to go to all your prenatal visits as directed by your caregiver. °SEEK MEDICAL CARE IF: °· You are unsure if you are in labor or if your water has broken. °· You have dizziness. °· You have   mild pelvic cramps, pelvic pressure, or nagging pain in your abdominal area. °· You have persistent nausea, vomiting, or diarrhea. °· You have a bad smelling vaginal discharge. °· You have pain with urination. °SEEK IMMEDIATE MEDICAL CARE IF:  °· You have a fever. °· You are leaking fluid from your vagina. °· You have spotting or bleeding from your vagina. °· You have severe abdominal cramping or pain. °· You have rapid weight loss or gain. °· You have shortness of breath with chest pain. °· You notice sudden or extreme swelling of your face, hands, ankles, feet, or legs. °· You have not felt your baby move in over an hour. °· You have severe headaches that do not go away with medicine. °· You have vision changes. °  °This information is not intended to replace advice given to you by your health care provider. Make sure you discuss any questions you have with your health care provider. °  °Document Released: 07/16/2001 Document  Revised: 08/12/2014 Document Reviewed: 09/22/2012 °Elsevier Interactive Patient Education ©2016 [employer] °Fetal Movement Counts °Patient Name: __________________________________________________ Patient Due Date: ____________________ °Performing a fetal movement count is highly recommended in high-risk pregnancies, but it is good for every pregnant woman to do. Your health care provider may ask you to start counting fetal movements at 28 weeks of the pregnancy. Fetal movements often increase: °· After eating a full meal. °· After physical activity. °· After eating or drinking something sweet or cold. °· At rest. °Pay attention to when you feel the baby is most active. This will help you notice a pattern of your baby's sleep and wake cycles and what factors contribute to an increase in fetal movement. It is important to perform a fetal movement count at the same time each day when your baby is normally most active.  °HOW TO COUNT FETAL MOVEMENTS °· Find a quiet and comfortable area to sit or lie down on your left side. Lying on your left side provides the best blood and oxygen circulation to your baby. °· Write down the day and time on a sheet of paper or in a journal. °· Start counting kicks, flutters, swishes, rolls, or jabs in a 2-hour period. You should feel at least 10 movements within 2 hours. °· If you do not feel 10 movements in 2 hours, wait 2-3 hours and count again. Look for a change in the pattern or not enough counts in 2 hours. °SEEK MEDICAL CARE IF: °· You feel less than 10 counts in 2 hours, tried twice. °· There is no movement in over an hour. °· The pattern is changing or taking longer each day to reach 10 counts in 2 hours. °· You feel the baby is not moving as he or she usually does. °Date: ____________ Movements: ____________ Start time: ____________ Finish time: ____________  °Date: ____________ Movements: ____________ Start time: ____________ Finish time: ____________ °Date: ____________  Movements: ____________ Start time: ____________ Finish time: ____________ °Date: ____________ Movements: ____________ Start time: ____________ Finish time: ____________ °Date: ____________ Movements: ____________ Start time: ____________ Finish time: ____________ °Date: ____________ Movements: ____________ Start time: ____________ Finish time: ____________ °Date: ____________ Movements: ____________ Start time: ____________ Finish time: ____________ °Date: ____________ Movements: ____________ Start time: ____________ Finish time: ____________  °Date: ____________ Movements: ____________ Start time: ____________ Finish time: ____________ °Date: ____________ Movements: ____________ Start time: ____________ Finish time: ____________ °Date: ____________ Movements: ____________ Start time: ____________ Finish time: ____________ °Date: ____________ Movements: ____________ Start time: ____________ Finish time: ____________ °Date:   ____________ Movements: ____________ Start time: ____________ Finish time: ____________ °Date: ____________ Movements: ____________ Start time: ____________ Finish time: ____________ °Date: ____________ Movements: ____________ Start time: ____________ Finish time: ____________  °Date: ____________ Movements: ____________ Start time: ____________ Finish time: ____________ °Date: ____________ Movements: ____________ Start time: ____________ Finish time: ____________ °Date: ____________ Movements: ____________ Start time: ____________ Finish time: ____________ °Date: ____________ Movements: ____________ Start time: ____________ Finish time: ____________ °Date: ____________ Movements: ____________ Start time: ____________ Finish time: ____________ °Date: ____________ Movements: ____________ Start time: ____________ Finish time: ____________ °Date: ____________ Movements: ____________ Start time: ____________ Finish time: ____________  °Date: ____________ Movements: ____________ Start time:  ____________ Finish time: ____________ °Date: ____________ Movements: ____________ Start time: ____________ Finish time: ____________ °Date: ____________ Movements: ____________ Start time: ____________ Finish time: ____________ °Date: ____________ Movements: ____________ Start time: ____________ Finish time: ____________ °Date: ____________ Movements: ____________ Start time: ____________ Finish time: ____________ °Date: ____________ Movements: ____________ Start time: ____________ Finish time: ____________ °Date: ____________ Movements: ____________ Start time: ____________ Finish time: ____________  °Date: ____________ Movements: ____________ Start time: ____________ Finish time: ____________ °Date: ____________ Movements: ____________ Start time: ____________ Finish time: ____________ °Date: ____________ Movements: ____________ Start time: ____________ Finish time: ____________ °Date: ____________ Movements: ____________ Start time: ____________ Finish time: ____________ °Date: ____________ Movements: ____________ Start time: ____________ Finish time: ____________ °Date: ____________ Movements: ____________ Start time: ____________ Finish time: ____________ °Date: ____________ Movements: ____________ Start time: ____________ Finish time: ____________  °Date: ____________ Movements: ____________ Start time: ____________ Finish time: ____________ °Date: ____________ Movements: ____________ Start time: ____________ Finish time: ____________ °Date: ____________ Movements: ____________ Start time: ____________ Finish time: ____________ °Date: ____________ Movements: ____________ Start time: ____________ Finish time: ____________ °Date: ____________ Movements: ____________ Start time: ____________ Finish time: ____________ °Date: ____________ Movements: ____________ Start time: ____________ Finish time: ____________ °Date: ____________ Movements: ____________ Start time: ____________ Finish time: ____________  °Date:  ____________ Movements: ____________ Start time: ____________ Finish time: ____________ °Date: ____________ Movements: ____________ Start time: ____________ Finish time: ____________ °Date: ____________ Movements: ____________ Start time: ____________ Finish time: ____________ °Date: ____________ Movements: ____________ Start time: ____________ Finish time: ____________ °Date: ____________ Movements: ____________ Start time: ____________ Finish time: ____________ °Date: ____________ Movements: ____________ Start time: ____________ Finish time: ____________ °Date: ____________ Movements: ____________ Start time: ____________ Finish time: ____________  °Date: ____________ Movements: ____________ Start time: ____________ Finish time: ____________ °Date: ____________ Movements: ____________ Start time: ____________ Finish time: ____________ °Date: ____________ Movements: ____________ Start time: ____________ Finish time: ____________ °Date: ____________ Movements: ____________ Start time: ____________ Finish time: ____________ °Date: ____________ Movements: ____________ Start time: ____________ Finish time: ____________ °Date: ____________ Movements: ____________ Start time: ____________ Finish time: ____________ °  °This information is not intended to replace advice given to you by your health care provider. Make sure you discuss any questions you have with your health care provider. °  °Document Released: 08/21/2006 Document Revised: 08/12/2014 Document Reviewed: 05/18/2012 °[employer]vier Interactive Patient Education ©2016 [employer] °Braxton Hicks Contractions °Contractions of the uterus can occur throughout pregnancy. Contractions are not always a sign that you are in labor.  °WHAT ARE BRAXTON HICKS CONTRACTIONS?  °Contractions that occur before labor are called Braxton Hicks contractions, or false labor. Toward the end of pregnancy (32-34 weeks), these contractions can develop more often and may become more  forceful. This is not true labor because these contractions do not result in opening (dilatation) and thinning of the cervix. They are sometimes difficult to tell apart from true labor because these contractions can be forceful and people have different pain tolerances. You should   not feel embarrassed if you go to the hospital with false labor. Sometimes, the only way to tell if you are in true labor is for your health care provider to look for changes in the cervix. °If there are no prenatal problems or other health problems associated with the pregnancy, it is completely safe to be sent home with false labor and await the onset of true labor. °HOW CAN YOU TELL THE DIFFERENCE BETWEEN TRUE AND FALSE LABOR? °False Labor °· The contractions of false labor are usually shorter and not as hard as those of true labor.   °· The contractions are usually irregular.   °· The contractions are often felt in the front of the lower abdomen and in the groin.   °· The contractions may go away when you walk around or change positions while lying down.   °· The contractions get weaker and are shorter lasting as time goes on.   °· The contractions do not usually become progressively stronger, regular, and closer together as with true labor.   °True Labor °· Contractions in true labor last 30-70 seconds, become very regular, usually become more intense, and increase in frequency.   °· The contractions do not go away with walking.   °· The discomfort is usually felt in the top of the uterus and spreads to the lower abdomen and low back.   °· True labor can be determined by your health care provider with an exam. This will show that the cervix is dilating and getting thinner.   °WHAT TO REMEMBER °· Keep up with your usual exercises and follow other instructions given by your health care provider.   °· Take medicines as directed by your health care provider.   °· Keep your regular prenatal appointments.   °· Eat and drink lightly if you  think you are going into labor.   °· If Braxton Hicks contractions are making you uncomfortable:   °· Change your position from lying down or resting to walking, or from walking to resting.   °· Sit and rest in a tub of warm water.   °· Drink 2-3 glasses of water. Dehydration may cause these contractions.   °· Do slow and deep breathing several times an hour.   °WHEN SHOULD I SEEK IMMEDIATE MEDICAL CARE? °Seek immediate medical care if: °· Your contractions become stronger, more regular, and closer together.   °· You have fluid leaking or gushing from your vagina.   °· You have a fever.   °· You pass blood-tinged mucus.   °· You have vaginal bleeding.   °· You have continuous abdominal pain.   °· You have low back pain that you never had before.   °· You feel your baby's head pushing down and causing pelvic pressure.   °· Your baby is not moving as much as it used to.   °  °This information is not intended to replace advice given to you by your health care provider. Make sure you discuss any questions you have with your health care provider. °  °Document Released: 07/22/2005 Document Revised: 07/27/2013 Document Reviewed: 05/03/2013 °Elsevier Interactive Patient Education ©2016 [employer] ° °

## 2015-08-25 NOTE — MAU Note (Signed)
Notified Dr. Henderson Cloud patient's cervical exam 2/80/-2, intact, received admit orders.

## 2015-08-25 NOTE — Anesthesia Preprocedure Evaluation (Signed)

## 2015-08-26 LAB — CBC
HCT: 26.8 % — ABNORMAL LOW (ref 36.0–46.0)
Hemoglobin: 9.7 g/dL — ABNORMAL LOW (ref 12.0–15.0)
MCH: 30.8 pg (ref 26.0–34.0)
MCHC: 36.2 g/dL — AB (ref 30.0–36.0)
MCV: 85.1 fL (ref 78.0–100.0)
PLATELETS: 183 10*3/uL (ref 150–400)
RBC: 3.15 MIL/uL — ABNORMAL LOW (ref 3.87–5.11)
RDW: 15.3 % (ref 11.5–15.5)
WBC: 15.2 10*3/uL — ABNORMAL HIGH (ref 4.0–10.5)

## 2015-08-26 LAB — RPR: RPR: NONREACTIVE

## 2015-08-26 NOTE — Anesthesia Postprocedure Evaluation (Signed)
Anesthesia Post Note  Patient: Joy Pena  Procedure(s) Performed: * No procedures listed *  Patient location during evaluation: Mother Baby Anesthesia Type: Epidural Level of consciousness: awake and alert and oriented Pain management: satisfactory to patient Vital Signs Assessment: post-procedure vital signs reviewed and stable Respiratory status: spontaneous breathing and nonlabored ventilation Cardiovascular status: stable Postop Assessment: no headache, no backache, no signs of nausea or vomiting, adequate PO intake and patient able to bend at knees (patient up walking) Anesthetic complications: no    Last Vitals:  Filed Vitals:   08/26/15 0340 08/26/15 0650  BP: 113/62 114/67  Pulse: 89 86  Temp: 36.9 C 37.3 C  Resp: 20 18    Last Pain:  Filed Vitals:   08/26/15 0651  PainSc: 0-No pain                 Agam Davenport

## 2015-08-26 NOTE — Discharge Summary (Signed)
Obstetric Discharge Summary Reason for Admission: onset of labor Prenatal Procedures: none Intrapartum Procedures: spontaneous vaginal delivery Postpartum Procedures: none Complications-Operative and Postpartum: 2 degree perineal laceration HEMOGLOBIN  Date Value Ref Range Status  08/26/2015 9.7* 12.0 - 15.0 g/dL Final   HCT  Date Value Ref Range Status  08/26/2015 26.8* 36.0 - 46.0 % Final    Discharge Diagnoses: Term Pregnancy-delivered  Discharge Information: Date: 08/26/2015 Activity: pelvic rest Diet: routine Medications: Ibuprofen and Iron Condition: stable Instructions: refer to practice specific booklet Discharge to: home Follow-up Information    Follow up with Leoda Smithhart A, MD In 4 weeks.   Specialty:  Obstetrics and Gynecology   Contact information:   [address] [place] [address] [phone]       Newborn Data: Live born female  Birth Weight: 7 lb 8.5 oz (3415 g) APGAR: 9, 9  Home with mother.  Joy Pena A 08/26/2015, 6:14 AM

## 2015-08-26 NOTE — Progress Notes (Signed)
Patient is eating, ambulating, voiding.  Pain control is good.  Filed Vitals:   08/25/15 2231 08/25/15 2323 08/26/15 0100 08/26/15 0340  BP: 124/83 136/79 115/71 113/62  Pulse: 89 84 79 89  Temp:  99.4 F (37.4 C) 100.2 F (37.9 C) 98.4 F (36.9 C)  TempSrc:      Resp: Height:      Weight:      SpO2:        Fundus firm Perineum without swelling.  Lab Results  Component Value Date   WBC 15.2* 08/26/2015   HGB 9.7* 08/26/2015   HCT 26.8* 08/26/2015   MCV 85.1 08/26/2015   PLT 183 08/26/2015    --/--/O POS, O POS (01/20 1130)/RI  A/P Post partum day 1.  Routine care.  Expect d/c tomorrow.    Geeta Dworkin A

## 2015-08-27 NOTE — Progress Notes (Signed)
Patient is eating, ambulating, voiding.  Pain control is good.  Filed Vitals:   08/26/15 0650 08/26/15 1200 08/26/15 1708 08/27/15 0658  BP: 114/67 117/57 114/66 106/64  Pulse: 86 103 83 73  Temp: 99.1 F (37.3 C) 97.6 F (36.4 C) 97.7 F (36.5 C) 98.3 F (36.8 C)  TempSrc:  Oral Oral Oral  Resp: Height:      Weight:      SpO2:  98% 100%     Fundus firm Perineum without swelling.  Lab Results  Component Value Date   WBC 15.2* 08/26/2015   HGB 9.7* 08/26/2015   HCT 26.8* 08/26/2015   MCV 85.1 08/26/2015   PLT 183 08/26/2015    --/--/O POS, O POS (01/20 1130)/RI  A/P Post partum day 2.  Routine care.  Expect d/c today.    Cortney Beissel A

## 2016-12-31 ENCOUNTER — Encounter (HOSPITAL_COMMUNITY): Payer: Self-pay | Admitting: *Deleted

## 2016-12-31 ENCOUNTER — Inpatient Hospital Stay (HOSPITAL_COMMUNITY): Payer: 59

## 2016-12-31 ENCOUNTER — Inpatient Hospital Stay (HOSPITAL_COMMUNITY)
Admission: AD | Admit: 2016-12-31 | Discharge: 2016-12-31 | Disposition: A | Discharge status: Home / Self Care | Payer: 59 | Source: Ambulatory Visit | Attending: Obstetrics and Gynecology | Admitting: Obstetrics and Gynecology

## 2016-12-31 DIAGNOSIS — Z3A01 Less than 8 weeks gestation of pregnancy: Secondary | ICD-10-CM | POA: Insufficient documentation

## 2016-12-31 DIAGNOSIS — O209 Hemorrhage in early pregnancy, unspecified: Secondary | ICD-10-CM | POA: Insufficient documentation

## 2016-12-31 DIAGNOSIS — O4692 Antepartum hemorrhage, unspecified, second trimester: Secondary | ICD-10-CM

## 2016-12-31 DIAGNOSIS — O3680X0 Pregnancy with inconclusive fetal viability, not applicable or unspecified: Secondary | ICD-10-CM | POA: Insufficient documentation

## 2016-12-31 LAB — URINALYSIS, ROUTINE W REFLEX MICROSCOPIC
BACTERIA UA: NONE SEEN
BILIRUBIN URINE: NEGATIVE
Glucose, UA: NEGATIVE mg/dL
Ketones, ur: NEGATIVE mg/dL
Leukocytes, UA: NEGATIVE
NITRITE: NEGATIVE
PH: 7 (ref 5.0–8.0)
Protein, ur: NEGATIVE mg/dL
RBC / HPF: NONE SEEN RBC/hpf (ref 0–5)
SPECIFIC GRAVITY, URINE: 1.002 — AB (ref 1.005–1.030)

## 2016-12-31 LAB — CBC WITH DIFFERENTIAL/PLATELET
Basophils Absolute: 0 10*3/uL (ref 0.0–0.1)
Basophils Relative: 0 %
EOS PCT: 1 %
Eosinophils Absolute: 0 10*3/uL (ref 0.0–0.7)
HCT: 34.5 % — ABNORMAL LOW (ref 36.0–46.0)
HEMOGLOBIN: 11.9 g/dL — AB (ref 12.0–15.0)
LYMPHS PCT: 40 %
Lymphs Abs: 2.8 10*3/uL (ref 0.7–4.0)
MCH: 28.7 pg (ref 26.0–34.0)
MCHC: 34.5 g/dL (ref 30.0–36.0)
MCV: 83.3 fL (ref 78.0–100.0)
Monocytes Absolute: 0.2 10*3/uL (ref 0.1–1.0)
Monocytes Relative: 3 %
NEUTROS PCT: 56 %
Neutro Abs: 3.9 10*3/uL (ref 1.7–7.7)
PLATELETS: 247 10*3/uL (ref 150–400)
RBC: 4.14 MIL/uL (ref 3.87–5.11)
RDW: 13.5 % (ref 11.5–15.5)
WBC: 6.9 10*3/uL (ref 4.0–10.5)

## 2016-12-31 LAB — HCG, QUANTITATIVE, PREGNANCY: hCG, Beta Chain, Quant, S: 628 m[IU]/mL — ABNORMAL HIGH (ref <5)

## 2016-12-31 LAB — POCT PREGNANCY, URINE: PREG TEST UR: POSITIVE — AB

## 2016-12-31 NOTE — MAU Note (Signed)
LMP 4/13  U/S scheduled 6/12--Dr. Rennis [place]ath [place] +vaginal bleeding started last night red/brown in color. Bleeding more today and OB advised to come in.  Denies any pain at this time.

## 2016-12-31 NOTE — Discharge Instructions (Signed)
Threatened Miscarriage °A threatened miscarriage is when you have vaginal bleeding during your first 20 weeks of pregnancy but the pregnancy has not ended. Your doctor will do tests to make sure you are still pregnant. The cause of the bleeding may not be known. This condition does not mean your pregnancy will end. It does increase the risk of it ending (complete miscarriage). °Follow these instructions at home: °· Make sure you keep all your doctor visits for prenatal care. °· Get plenty of rest. °· Do not have sex or use tampons if you have vaginal bleeding. °· Do not douche. °· Do not smoke or use drugs. °· Do not drink alcohol. °· Avoid caffeine. °Contact a doctor if: °· You have light bleeding from your vagina. °· You have belly pain or cramping. °· You have a fever. °Get help right away if: °· You have heavy bleeding from your vagina. °· You have clots of blood coming from your vagina. °· You have bad pain or cramps in your low back or belly. °· You have fever, chills, and bad belly pain. °This information is not intended to replace advice given to you by your health care provider. Make sure you discuss any questions you have with your health care provider. °Document Released: 07/04/2008 Document Revised: 12/28/2015 Document Reviewed: 05/18/2013 °Elsevier Interactive Patient Education © 2017 [employer] ° °

## 2016-12-31 NOTE — MAU Provider Note (Signed)
History     CSN: [id number]  Arrival date and time: 12/31/16 1436   First Provider Initiated Contact with Patient 12/31/16 1519      Chief Complaint  Patient presents with  . Vaginal Bleeding  . Possible Pregnancy   HPI Joy Pena is a 26 y.o. G3P1011 at 3451w4d who presents to MAU today with complaint of vaginal bleeding since last night. She states 2/10 associated pelvic pain. She denies discharge, fever or recent intercourse. She has not taken anything for pain.   OB History    Gravida Para Term Preterm AB Living   3 1 1   1 1    SAB TAB Ectopic Multiple Live Births   1     0 1      Past Medical History:  Diagnosis Date  . Hemorrhoid   . PONV (postoperative nausea and vomiting)   . UTI (lower urinary tract infection)   . Yeast infection of the vagina     Past Surgical History:  Procedure Laterality Date  . APPENDECTOMY    . BREAST SURGERY     Breast reduction  . TONSILLECTOMY    . WISDOM TOOTH EXTRACTION      Family History  Problem Relation Age of Onset  . Cancer Maternal Grandfather     Social History  Substance Use Topics  . Smoking status: Never Smoker  . Smokeless tobacco: Never Used  . Alcohol use No     Comment: not while pregnant    Allergies: No Known Allergies  Prescriptions Prior to Admission  Medication Sig Dispense Refill Last Dose  . Prenatal Vit-Fe Fumarate-FA (PRENATAL MULTIVITAMIN) TABS tablet Take 1 tablet by mouth daily at 12 noon.   12/30/2016 at Unknown time    Review of Systems  Constitutional: Negative for fever.  Gastrointestinal: Positive for abdominal pain and nausea. Negative for constipation, diarrhea and vomiting.  Genitourinary: Positive for vaginal bleeding. Negative for dysuria, frequency, urgency and vaginal discharge.   Physical Exam   Blood pressure 121/77, pulse 81, temperature 98.5 F (36.9 C), temperature source Oral, resp. rate 18, weight 133 lb (60.3 kg), last menstrual period 11/15/2016, SpO2 100 %,  unknown if currently breastfeeding.  Physical Exam  Nursing note and vitals reviewed. Constitutional: She is oriented to person, place, and time. She appears well-developed and well-nourished. No distress.  HENT:  Head: Normocephalic and atraumatic.  Cardiovascular: Normal rate.   Respiratory: Effort normal.  GI: Soft. She exhibits no distension and no mass. There is no tenderness. There is no rebound and no guarding.  Genitourinary: Uterus is not enlarged and not tender. Cervix exhibits no motion tenderness, no discharge and no friability. Right adnexum displays no mass and no tenderness. Left adnexum displays no mass and no tenderness. There is bleeding (small) in the vagina. No vaginal discharge found.  Neurological: She is alert and oriented to person, place, and time.  Skin: Skin is warm and dry. No erythema.  Psychiatric: She has a normal mood and affect.    Results for orders placed or performed during the hospital encounter of 12/31/16 (from the past 24 hour(s))  Urinalysis, Routine w reflex microscopic     Status: Abnormal   Collection Time: 12/31/16  2:54 PM  Result Value Ref Range   Color, Urine STRAW (A) YELLOW   APPearance CLEAR CLEAR   Specific Gravity, Urine 1.002 (L) 1.005 - 1.030   pH 7.0 5.0 - 8.0   Glucose, UA NEGATIVE NEGATIVE mg/dL   Hgb urine  dipstick MODERATE (A) NEGATIVE   Bilirubin Urine NEGATIVE NEGATIVE   Ketones, ur NEGATIVE NEGATIVE mg/dL   Protein, ur NEGATIVE NEGATIVE mg/dL   Nitrite NEGATIVE NEGATIVE   Leukocytes, UA NEGATIVE NEGATIVE   RBC / HPF NONE SEEN 0 - 5 RBC/hpf   WBC, UA 0-5 0 - 5 WBC/hpf   Bacteria, UA NONE SEEN NONE SEEN   Squamous Epithelial / LPF 0-5 (A) NONE SEEN  Pregnancy, urine POC     Status: Abnormal   Collection Time: 12/31/16  3:29 PM  Result Value Ref Range   Preg Test, Ur POSITIVE (A) NEGATIVE  CBC with Differential/Platelet     Status: Abnormal   Collection Time: 12/31/16  3:40 PM  Result Value Ref Range   WBC 6.9 4.0 -  10.5 K/uL   RBC 4.14 3.87 - 5.11 MIL/uL   Hemoglobin 11.9 (L) 12.0 - 15.0 g/dL   HCT 16.1 (L) 09.6 - 04.5 %   MCV 83.3 78.0 - 100.0 fL   MCH 28.7 26.0 - 34.0 pg   MCHC 34.5 30.0 - 36.0 g/dL   RDW 40.9 81.1 - 91.4 %   Platelets 247 150 - 400 K/uL   Neutrophils Relative % 56 %   Neutro Abs 3.9 1.7 - 7.7 K/uL   Lymphocytes Relative 40 %   Lymphs Abs 2.8 0.7 - 4.0 K/uL   Monocytes Relative 3 %   Monocytes Absolute 0.2 0.1 - 1.0 K/uL   Eosinophils Relative 1 %   Eosinophils Absolute 0.0 0.0 - 0.7 K/uL   Basophils Relative 0 %   Basophils Absolute 0.0 0.0 - 0.1 K/uL  hCG, quantitative, pregnancy     Status: Abnormal   Collection Time: 12/31/16  3:40 PM  Result Value Ref Range   hCG, Beta Chain, Quant, S 628 (H) <5 mIU/mL   US Ob Comp Less 14 Wks  Result Date: 12/31/2016 CLINICAL DATA:  26 year old pregnant female presents with vaginal bleeding. Quantitative beta HCG is pending. EDC by LMP: 08/22/2017, projecting to an expected gestational age of [redacted] weeks 4 days. EXAM: OBSTETRIC <14 WK [place] AND TRANSVAGINAL OB US TECHNIQUE: Both transabdominal and transvaginal ultrasound examinations were performed for complete evaluation of the gestation as well as the maternal uterus, adnexal regions, and pelvic cul-de-sac. Transvaginal technique was performed to assess early pregnancy. COMPARISON:  None. FINDINGS: The anteverted uterus. There is a sac-like intrauterine cystic structure with apparent double decidual sac sign with mean sac diameter 2.9 mm, projecting to a gestational age of [redacted] weeks 6 days. No yolk sac or embryo are visualized within this sac-like structure. No additional focal endometrial abnormality. No uterine fibroids. Left ovary measures 4.0 x 2.1 x 2.2 cm. Right ovary measures 4.0 x 2.1 x 2.4 cm and contains a probable corpus luteum. No abnormal ovarian or adnexal masses. No abnormal free fluid in the pelvis. IMPRESSION: Pregnancy is not definitively localized on this scan. Probable  intrauterine gestational sac with double decidual sac sign at 4 weeks 6 days by mean sac diameter, although no yolk sac or embryo are seen at this time for definitive diagnosis. No abnormal ovarian or adnexal masses. Sonographic differential diagnosis includes intrauterine gestation, spontaneous abortion or occult ectopic gestation. Recommend close clinical follow-up and serial serum beta HCG monitoring, with repeat obstetric scan in 2 weeks or earlier as clinically warranted. Electronically Signed   By: Delbert Phenix M.D.   On: 12/31/2016 17:04   US Ob Transvaginal  Result Date: 12/31/2016 CLINICAL DATA:  26 year old pregnant female  presents with vaginal bleeding. Quantitative beta HCG is pending. EDC by LMP: 08/22/2017, projecting to an expected gestational age of [redacted] weeks 4 days. EXAM: OBSTETRIC <14 WK [place] AND TRANSVAGINAL OB US TECHNIQUE: Both transabdominal and transvaginal ultrasound examinations were performed for complete evaluation of the gestation as well as the maternal uterus, adnexal regions, and pelvic cul-de-sac. Transvaginal technique was performed to assess early pregnancy. COMPARISON:  None. FINDINGS: The anteverted uterus. There is a sac-like intrauterine cystic structure with apparent double decidual sac sign with mean sac diameter 2.9 mm, projecting to a gestational age of [redacted] weeks 6 days. No yolk sac or embryo are visualized within this sac-like structure. No additional focal endometrial abnormality. No uterine fibroids. Left ovary measures 4.0 x 2.1 x 2.2 cm. Right ovary measures 4.0 x 2.1 x 2.4 cm and contains a probable corpus luteum. No abnormal ovarian or adnexal masses. No abnormal free fluid in the pelvis. IMPRESSION: Pregnancy is not definitively localized on this scan. Probable intrauterine gestational sac with double decidual sac sign at 4 weeks 6 days by mean sac diameter, although no yolk sac or embryo are seen at this time for definitive diagnosis. No abnormal ovarian or adnexal  masses. Sonographic differential diagnosis includes intrauterine gestation, spontaneous abortion or occult ectopic gestation. Recommend close clinical follow-up and serial serum beta HCG monitoring, with repeat obstetric scan in 2 weeks or earlier as clinically warranted. Electronically Signed   By: Delbert Phenix M.D.   On: 12/31/2016 17:04    MAU Course  Procedures None  MDM +UPT UA, CBC, quant hCG and [place] today to rule out ectopic pregnancy O+ blood type in Epic from previous pregnancy Discussed patient with Dr. Dareen Piano. Follow-up in the office in 2 days for repeat labs  Assessment and Plan  A: Vaginal bleeding in pregnancy, first trimester Pregnancy of unknown location  P: Discharge home Bleeding and ectopic precautions discussed Pelvic rest advised Patient advised to follow-up with [facility] in 2 days for repeat hCG Patient may return to MAU as needed or if her condition were to change or worsen  Vonzella Nipple, PA-C 12/31/2016, 5:34 PM

## 2016-12-31 NOTE — MAU Note (Signed)
Had an IUD take out on April 5th; 2 positive UPT-  one a week ago and one was yesterday; c/o  vaginal bleeding since this morning; hx of miscarriage;

## 2017-08-05 NOTE — L&D Delivery Note (Addendum)
Patient was C/C/+2 and pushed for 5 minutes with epidural.    NSVD  female infant, Apgars 9,9, weight P.   Triple nuchal cord was reduced. The patient had Pena second degree laceration of L labia repaired with 3-0 vicryl R. Fundus was firm. EBL was expected amount. Placenta was delivered intact. Vagina was clear.  Delayed cord clamping done for 30-60 seconds while warming baby. Baby was vigorous and doing skin to skin with mother.  Joy Pena

## 2017-10-08 ENCOUNTER — Encounter (HOSPITAL_COMMUNITY): Payer: Self-pay | Admitting: *Deleted

## 2017-10-08 ENCOUNTER — Other Ambulatory Visit: Payer: Self-pay

## 2017-10-08 ENCOUNTER — Inpatient Hospital Stay (HOSPITAL_COMMUNITY)
Admission: AD | Admit: 2017-10-08 | Discharge: 2017-10-08 | Disposition: A | Discharge status: Home / Self Care | Payer: Medicaid Other | Source: Ambulatory Visit | Attending: Obstetrics and Gynecology | Admitting: Obstetrics and Gynecology

## 2017-10-08 DIAGNOSIS — Z3689 Encounter for other specified antenatal screening: Secondary | ICD-10-CM

## 2017-10-08 DIAGNOSIS — O368130 Decreased fetal movements, third trimester, not applicable or unspecified: Secondary | ICD-10-CM

## 2017-10-08 DIAGNOSIS — Z3A39 39 weeks gestation of pregnancy: Secondary | ICD-10-CM | POA: Insufficient documentation

## 2017-10-08 LAB — URINALYSIS, ROUTINE W REFLEX MICROSCOPIC
Bilirubin Urine: NEGATIVE
GLUCOSE, UA: NEGATIVE mg/dL
HGB URINE DIPSTICK: NEGATIVE
Ketones, ur: NEGATIVE mg/dL
Leukocytes, UA: NEGATIVE
Nitrite: NEGATIVE
PH: 6 (ref 5.0–8.0)
Protein, ur: NEGATIVE mg/dL
SPECIFIC GRAVITY, URINE: 1.02 (ref 1.005–1.030)

## 2017-10-08 NOTE — Discharge Instructions (Signed)
Braxton Hicks Contractions °Contractions of the uterus can occur throughout pregnancy, but they are not always a sign that you are in labor. You may have practice contractions called Braxton Hicks contractions. These false labor contractions are sometimes confused with true labor. °What are Braxton Hicks contractions? °Braxton Hicks contractions are tightening movements that occur in the muscles of the uterus before labor. Unlike true labor contractions, these contractions do not result in opening (dilation) and thinning of the cervix. Toward the end of pregnancy (32-34 weeks), Braxton Hicks contractions can happen more often and may become stronger. These contractions are sometimes difficult to tell apart from true labor because they can be very uncomfortable. You should not feel embarrassed if you go to the hospital with false labor. °Sometimes, the only way to tell if you are in true labor is for your health care provider to look for changes in the cervix. The health care provider will do a physical exam and may monitor your contractions. If you are not in true labor, the exam should show that your cervix is not dilating and your water has not broken. °If there are other health problems associated with your pregnancy, it is completely safe for you to be sent home with false labor. You may continue to have Braxton Hicks contractions until you go into true labor. °How to tell the difference between true labor and false labor °True labor °· Contractions last 30-70 seconds. °· Contractions become very regular. °· Discomfort is usually felt in the top of the uterus, and it spreads to the lower abdomen and low back. °· Contractions do not go away with walking. °· Contractions usually become more intense and increase in frequency. °· The cervix dilates and gets thinner. °False labor °· Contractions are usually shorter and not as strong as true labor contractions. °· Contractions are usually irregular. °· Contractions  are often felt in the front of the lower abdomen and in the groin. °· Contractions may go away when you walk around or change positions while lying down. °· Contractions get weaker and are shorter-lasting as time goes on. °· The cervix usually does not dilate or become thin. °Follow these instructions at home: °· Take over-the-counter and prescription medicines only as told by your health care provider. °· Keep up with your usual exercises and follow other instructions from your health care provider. °· Eat and drink lightly if you think you are going into labor. °· If Braxton Hicks contractions are making you uncomfortable: °? Change your position from lying down or resting to walking, or change from walking to resting. °? Sit and rest in a tub of warm water. °? Drink enough fluid to keep your urine pale yellow. Dehydration may cause these contractions. °? Do slow and deep breathing several times an hour. °· Keep all follow-up prenatal visits as told by your health care provider. This is important. °Contact a health care provider if: °· You have a fever. °· You have continuous pain in your abdomen. °Get help right away if: °· Your contractions become stronger, more regular, and closer together. °· You have fluid leaking or gushing from your vagina. °· You pass blood-tinged mucus (bloody show). °· You have bleeding from your vagina. °· You have low back pain that you never had before. °· You feel your baby’s head pushing down and causing pelvic pressure. °· Your baby is not moving inside you as much as it used to. °Summary °· Contractions that occur before labor are called Braxton   Hicks contractions, false labor, or practice contractions. °· Braxton Hicks contractions are usually shorter, weaker, farther apart, and less regular than true labor contractions. True labor contractions usually become progressively stronger and regular and they become more frequent. °· Manage discomfort from Braxton Hicks contractions by  changing position, resting in a warm bath, drinking plenty of water, or practicing deep breathing. °This information is not intended to replace advice given to you by your health care provider. Make sure you discuss any questions you have with your health care provider. °Document Released: 12/05/2016 Document Revised: 12/05/2016 Document Reviewed: 12/05/2016 °Elsevier Interactive Patient Education © 2018 [employer] ° °Fetal Movement Counts °Patient Name: ________________________________________________ Patient Due Date: ____________________ °What is a fetal movement count? °A fetal movement count is the number of times that you feel your baby move during a certain amount of time. This may also be called a fetal kick count. A fetal movement count is recommended for every pregnant woman. You may be asked to start counting fetal movements as early as week 28 of your pregnancy. °Pay attention to when your baby is most active. You may notice your baby's sleep and wake cycles. You may also notice things that make your baby move more. You should do a fetal movement count: °· When your baby is normally most active. °· At the same time each day. ° °A good time to count movements is while you are resting, after having something to eat and drink. °How do I count fetal movements? °1. Find a quiet, comfortable area. Sit, or lie down on your side. °2. Write down the date, the start time and stop time, and the number of movements that you felt between those two times. Take this information with you to your health care visits. °3. For 2 hours, count kicks, flutters, swishes, rolls, and jabs. You should feel at least 10 movements during 2 hours. °4. You may stop counting after you have felt 10 movements. °5. If you do not feel 10 movements in 2 hours, have something to eat and drink. Then, keep resting and counting for 1 hour. If you feel at least 4 movements during that hour, you may stop counting. °Contact a health care  provider if: °· You feel fewer than 4 movements in 2 hours. °· Your baby is not moving like he or she usually does. °Date: ____________ Start time: ____________ Stop time: ____________ Movements: ____________ °Date: ____________ Start time: ____________ Stop time: ____________ Movements: ____________ °Date: ____________ Start time: ____________ Stop time: ____________ Movements: ____________ °Date: ____________ Start time: ____________ Stop time: ____________ Movements: ____________ °Date: ____________ Start time: ____________ Stop time: ____________ Movements: ____________ °Date: ____________ Start time: ____________ Stop time: ____________ Movements: ____________ °Date: ____________ Start time: ____________ Stop time: ____________ Movements: ____________ °Date: ____________ Start time: ____________ Stop time: ____________ Movements: ____________ °Date: ____________ Start time: ____________ Stop time: ____________ Movements: ____________ °This information is not intended to replace advice given to you by your health care provider. Make sure you discuss any questions you have with your health care provider. °Document Released: 08/21/2006 Document Revised: 03/20/2016 Document Reviewed: 08/31/2015 °Elsevier Interactive Patient Education © 2018 [employer] ° °

## 2017-10-08 NOTE — MAU Provider Note (Signed)
History     CSN: [id number]  Arrival date and time: 10/08/17 1801    Chief Complaint  Patient presents with  . Decreased Fetal Movement  . Abdominal Pain   G4P1021 @39 .0 wks here with decreased FM. Reports less movement this afternoon. Denies VB, LOF, or regular ctx. Reports increased pelvic pressure and discomfort today. Had membranes swept yesterday. Pregnancy has been uncomplicated.    OB History    Gravida Para Term Preterm AB Living   4 1 1   2 1    SAB TAB Ectopic Multiple Live Births   2     0 1      Past Medical History:  Diagnosis Date  . Hemorrhoid   . PONV (postoperative nausea and vomiting)   . UTI (lower urinary tract infection)   . Yeast infection of the vagina     Past Surgical History:  Procedure Laterality Date  . APPENDECTOMY    . BREAST SURGERY     Breast reduction  . TONSILLECTOMY    . WISDOM TOOTH EXTRACTION      Family History  Problem Relation Age of Onset  . Cancer Maternal Grandfather     Social History   Tobacco Use  . Smoking status: Never Smoker  . Smokeless tobacco: Never Used  Substance Use Topics  . Alcohol use: No    Comment: not while pregnant  . Drug use: No    Allergies: No Known Allergies  Medications Prior to Admission  Medication Sig Dispense Refill Last Dose  . Prenatal Vit-Fe Fumarate-FA (PRENATAL MULTIVITAMIN) TABS tablet Take 1 tablet by mouth daily at 12 noon.   12/30/2016 at Unknown time    Review of Systems  Gastrointestinal: Negative for abdominal pain.  Genitourinary: Positive for vaginal discharge (mucous). Negative for vaginal bleeding.   Physical Exam   Blood pressure 129/72, pulse 97, temperature 99.1 F (37.3 C), temperature source Oral, resp. rate 18, height 5\' 2"  (1.575 m), weight 155 lb 4 oz (70.4 kg), last menstrual period 11/15/2016, SpO2 99 %, unknown if currently breastfeeding.  Physical Exam  Nursing note and vitals reviewed. Constitutional: She is oriented to person, place, and time.  She appears well-developed and well-nourished. No distress.  HENT:  Head: Normocephalic and atraumatic.  Neck: Normal range of motion.  Respiratory: Effort normal. No respiratory distress.  GI: Soft. She exhibits no distension. There is no tenderness.  gravid  Genitourinary:  Genitourinary Comments: VE: 2.5/70/-2, vtx  Musculoskeletal: Normal range of motion.  Neurological: She is alert and oriented to person, place, and time.  Skin: Skin is warm and dry.  Psychiatric: She has a normal mood and affect.  EFM: 145 bpm, mod variability, + accels, no decels Toco: irregular  Results for orders placed or performed during the hospital encounter of 10/08/17 (from the past 24 hour(s))  Urinalysis, Routine w reflex microscopic     Status: None   Collection Time: 10/08/17  6:19 PM  Result Value Ref Range   Color, Urine YELLOW YELLOW   APPearance CLEAR CLEAR   Specific Gravity, Urine 1.020 1.005 - 1.030   pH 6.0 5.0 - 8.0   Glucose, UA NEGATIVE NEGATIVE mg/dL   Hgb urine dipstick NEGATIVE NEGATIVE   Bilirubin Urine NEGATIVE NEGATIVE   Ketones, ur NEGATIVE NEGATIVE mg/dL   Protein, ur NEGATIVE NEGATIVE mg/dL   Nitrite NEGATIVE NEGATIVE   Leukocytes, UA NEGATIVE NEGATIVE   MAU Course  Procedures  MDM Pt feeling more FM since EFM applied. FM audible. No signs  of labor. Presentation, clinical findings, and plan discussed with Dr. Tenny Craw. Stable for discharge home.  Assessment and Plan   1. [redacted] weeks gestation of pregnancy   2. NST (non-stress test) reactive   3. Decreased fetal movements in third trimester, single or unspecified fetus    Discharge home Follow up in OB office next week as scheduled [place] Labor precautions  Allergies as of 10/08/2017   No Known Allergies     Medication List    TAKE these medications   prenatal multivitamin Tabs tablet Take 1 tablet by mouth daily at 12 noon.      Donette Larry, CNM 10/08/2017, 7:06 PM

## 2017-10-08 NOTE — MAU Note (Signed)
Had a membrane sweep done yesterday, just hasn't really been feeling great.  Less movement today. None since she got up from nap.  Hurting from waste down, just aches.

## 2017-10-10 ENCOUNTER — Other Ambulatory Visit: Payer: Self-pay

## 2017-10-10 ENCOUNTER — Encounter (HOSPITAL_COMMUNITY): Payer: Self-pay | Admitting: *Deleted

## 2017-10-10 ENCOUNTER — Inpatient Hospital Stay (HOSPITAL_COMMUNITY): Payer: Medicaid Other | Admitting: Anesthesiology

## 2017-10-10 ENCOUNTER — Inpatient Hospital Stay (HOSPITAL_COMMUNITY)
Admission: AD | Admit: 2017-10-10 | Discharge: 2017-10-11 | DRG: 807 | Disposition: A | Discharge status: Home / Self Care | Payer: Medicaid Other | Source: Ambulatory Visit | Attending: Obstetrics and Gynecology | Admitting: Obstetrics and Gynecology

## 2017-10-10 DIAGNOSIS — Z3A39 39 weeks gestation of pregnancy: Secondary | ICD-10-CM

## 2017-10-10 DIAGNOSIS — Z3483 Encounter for supervision of other normal pregnancy, third trimester: Secondary | ICD-10-CM | POA: Diagnosis present

## 2017-10-10 LAB — OB RESULTS CONSOLE ABO/RH: RH Type: POSITIVE

## 2017-10-10 LAB — CBC
HEMATOCRIT: 28.4 % — AB (ref 36.0–46.0)
HEMOGLOBIN: 10 g/dL — AB (ref 12.0–15.0)
MCH: 27.8 pg (ref 26.0–34.0)
MCHC: 35.2 g/dL (ref 30.0–36.0)
MCV: 78.9 fL (ref 78.0–100.0)
Platelets: 237 10*3/uL (ref 150–400)
RBC: 3.6 MIL/uL — AB (ref 3.87–5.11)
RDW: 14.2 % (ref 11.5–15.5)
WBC: 13.2 10*3/uL — ABNORMAL HIGH (ref 4.0–10.5)

## 2017-10-10 LAB — TYPE AND SCREEN
ABO/RH(D): O POS
ANTIBODY SCREEN: NEGATIVE

## 2017-10-10 LAB — OB RESULTS CONSOLE RPR: RPR: NONREACTIVE

## 2017-10-10 LAB — OB RESULTS CONSOLE HEPATITIS B SURFACE ANTIGEN: HEP B S AG: NEGATIVE

## 2017-10-10 LAB — OB RESULTS CONSOLE HIV ANTIBODY (ROUTINE TESTING): HIV: NONREACTIVE

## 2017-10-10 LAB — OB RESULTS CONSOLE ANTIBODY SCREEN: Antibody Screen: NEGATIVE

## 2017-10-10 LAB — OB RESULTS CONSOLE RUBELLA ANTIBODY, IGM: RUBELLA: IMMUNE

## 2017-10-10 LAB — RPR: RPR: NONREACTIVE

## 2017-10-10 LAB — OB RESULTS CONSOLE GBS: GBS: NEGATIVE

## 2017-10-10 LAB — OB RESULTS CONSOLE GC/CHLAMYDIA
CHLAMYDIA, DNA PROBE: NEGATIVE
GC PROBE AMP, GENITAL: NEGATIVE

## 2017-10-10 MED ORDER — LACTATED RINGERS IV SOLN: 500.0000 mL | INTRAVENOUS | Status: DC | PRN

## 2017-10-10 MED ORDER — FAMOTIDINE 20 MG PO TABS: 20.0000 mg | ORAL_TABLET | Freq: Every day | ORAL | Status: DC | PRN

## 2017-10-10 MED ORDER — IBUPROFEN 800 MG PO TABS
800.0000 mg | ORAL_TABLET | Freq: Three times a day (TID) | ORAL | Status: DC
  Administered 2017-10-10 – 2017-10-11 (×3): 800 mg via ORAL
  Filled 2017-10-10 (×3): qty 1

## 2017-10-10 MED ORDER — TERBUTALINE SULFATE 1 MG/ML IJ SOLN
0.2500 mg | Freq: Once | INTRAMUSCULAR | Status: DC | PRN
  Filled 2017-10-10: qty 1

## 2017-10-10 MED ORDER — DIPHENHYDRAMINE HCL 50 MG/ML IJ SOLN: 12.5000 mg | INTRAMUSCULAR | Status: DC | PRN

## 2017-10-10 MED ORDER — METHYLERGONOVINE MALEATE 0.2 MG PO TABS: 0.2000 mg | ORAL_TABLET | ORAL | Status: DC | PRN

## 2017-10-10 MED ORDER — SENNOSIDES-DOCUSATE SODIUM 8.6-50 MG PO TABS
2.0000 | ORAL_TABLET | ORAL | Status: DC
  Administered 2017-10-10: 2 via ORAL
  Filled 2017-10-10: qty 2

## 2017-10-10 MED ORDER — OXYTOCIN 40 UNITS IN LACTATED RINGERS INFUSION - SIMPLE MED
2.5000 [IU]/h | INTRAVENOUS | Status: DC
  Filled 2017-10-10: qty 1000

## 2017-10-10 MED ORDER — BENZOCAINE-MENTHOL 20-0.5 % EX AERO
1.0000 "application " | INHALATION_SPRAY | CUTANEOUS | Status: DC | PRN
  Administered 2017-10-10: 1 via TOPICAL
  Filled 2017-10-10: qty 56

## 2017-10-10 MED ORDER — EPHEDRINE 5 MG/ML INJ
10.0000 mg | INTRAVENOUS | Status: DC | PRN
  Filled 2017-10-10: qty 2

## 2017-10-10 MED ORDER — OXYCODONE-ACETAMINOPHEN 5-325 MG PO TABS: 2.0000 | ORAL_TABLET | ORAL | Status: DC | PRN

## 2017-10-10 MED ORDER — ONDANSETRON HCL 4 MG/2ML IJ SOLN
4.0000 mg | Freq: Four times a day (QID) | INTRAMUSCULAR | Status: DC | PRN
  Administered 2017-10-10: 4 mg via INTRAVENOUS
  Filled 2017-10-10: qty 2

## 2017-10-10 MED ORDER — PHENYLEPHRINE 40 MCG/ML (10ML) SYRINGE FOR IV PUSH (FOR BLOOD PRESSURE SUPPORT)
80.0000 ug | PREFILLED_SYRINGE | INTRAVENOUS | Status: DC | PRN
  Filled 2017-10-10: qty 5

## 2017-10-10 MED ORDER — SODIUM CHLORIDE 0.9 % IV SOLN: 250.0000 mL | INTRAVENOUS | Status: DC | PRN

## 2017-10-10 MED ORDER — MAGNESIUM HYDROXIDE 400 MG/5ML PO SUSP: 30.0000 mL | ORAL | Status: DC | PRN

## 2017-10-10 MED ORDER — ACETAMINOPHEN 325 MG PO TABS: 650.0000 mg | ORAL_TABLET | ORAL | Status: DC | PRN

## 2017-10-10 MED ORDER — SODIUM CHLORIDE 0.9% FLUSH: 3.0000 mL | INTRAVENOUS | Status: DC | PRN

## 2017-10-10 MED ORDER — LIDOCAINE HCL (PF) 1 % IJ SOLN
30.0000 mL | INTRAMUSCULAR | Status: DC | PRN
  Administered 2017-10-10: 30 mL via SUBCUTANEOUS
  Filled 2017-10-10: qty 30

## 2017-10-10 MED ORDER — FENTANYL 2.5 MCG/ML BUPIVACAINE 1/10 % EPIDURAL INFUSION (WH - ANES)
14.0000 mL/h | INTRAMUSCULAR | Status: DC | PRN
  Administered 2017-10-10: 14 mL/h via EPIDURAL
  Filled 2017-10-10: qty 100

## 2017-10-10 MED ORDER — PRENATAL MULTIVITAMIN CH
1.0000 | ORAL_TABLET | Freq: Every day | ORAL | Status: DC
  Administered 2017-10-10: 1 via ORAL
  Filled 2017-10-10: qty 1

## 2017-10-10 MED ORDER — DIPHENHYDRAMINE HCL 25 MG PO CAPS: 25.0000 mg | ORAL_CAPSULE | Freq: Four times a day (QID) | ORAL | Status: DC | PRN

## 2017-10-10 MED ORDER — FLEET ENEMA 7-19 GM/118ML RE ENEM: 1.0000 | ENEMA | RECTAL | Status: DC | PRN

## 2017-10-10 MED ORDER — OXYCODONE-ACETAMINOPHEN 5-325 MG PO TABS: 1.0000 | ORAL_TABLET | ORAL | Status: DC | PRN

## 2017-10-10 MED ORDER — DIBUCAINE 1 % RE OINT: 1.0000 "application " | TOPICAL_OINTMENT | RECTAL | Status: DC | PRN

## 2017-10-10 MED ORDER — ONDANSETRON HCL 4 MG/2ML IJ SOLN: 4.0000 mg | INTRAMUSCULAR | Status: DC | PRN

## 2017-10-10 MED ORDER — COCONUT OIL OIL
1.0000 "application " | TOPICAL_OIL | Status: DC | PRN
  Administered 2017-10-11: 1 via TOPICAL
  Filled 2017-10-10: qty 120

## 2017-10-10 MED ORDER — WITCH HAZEL-GLYCERIN EX PADS: 1.0000 "application " | MEDICATED_PAD | CUTANEOUS | Status: DC | PRN

## 2017-10-10 MED ORDER — METHYLERGONOVINE MALEATE 0.2 MG/ML IJ SOLN: 0.2000 mg | INTRAMUSCULAR | Status: DC | PRN

## 2017-10-10 MED ORDER — TETANUS-DIPHTH-ACELL PERTUSSIS 5-2.5-18.5 LF-MCG/0.5 IM SUSP: 0.5000 mL | Freq: Once | INTRAMUSCULAR | Status: DC

## 2017-10-10 MED ORDER — ACETAMINOPHEN 325 MG PO TABS
650.0000 mg | ORAL_TABLET | ORAL | Status: DC | PRN
  Administered 2017-10-10 – 2017-10-11 (×2): 650 mg via ORAL
  Filled 2017-10-10 (×2): qty 2

## 2017-10-10 MED ORDER — MEASLES, MUMPS & RUBELLA VAC ~~LOC~~ INJ
0.5000 mL | INJECTION | Freq: Once | SUBCUTANEOUS | Status: DC
  Filled 2017-10-10: qty 0.5

## 2017-10-10 MED ORDER — FERROUS SULFATE 325 (65 FE) MG PO TABS
325.0000 mg | ORAL_TABLET | Freq: Two times a day (BID) | ORAL | Status: DC
  Administered 2017-10-10 – 2017-10-11 (×2): 325 mg via ORAL
  Filled 2017-10-10 (×2): qty 1

## 2017-10-10 MED ORDER — SOD CITRATE-CITRIC ACID 500-334 MG/5ML PO SOLN
30.0000 mL | ORAL | Status: DC | PRN
  Administered 2017-10-10: 30 mL via ORAL
  Filled 2017-10-10: qty 15

## 2017-10-10 MED ORDER — PHENYLEPHRINE 40 MCG/ML (10ML) SYRINGE FOR IV PUSH (FOR BLOOD PRESSURE SUPPORT)
80.0000 ug | PREFILLED_SYRINGE | INTRAVENOUS | Status: DC | PRN
  Filled 2017-10-10: qty 5
  Filled 2017-10-10: qty 10

## 2017-10-10 MED ORDER — SODIUM CHLORIDE 0.9% FLUSH: 3.0000 mL | Freq: Two times a day (BID) | INTRAVENOUS | Status: DC

## 2017-10-10 MED ORDER — SIMETHICONE 80 MG PO CHEW: 80.0000 mg | CHEWABLE_TABLET | ORAL | Status: DC | PRN

## 2017-10-10 MED ORDER — LACTATED RINGERS IV SOLN: 500.0000 mL | Freq: Once | INTRAVENOUS | Status: DC

## 2017-10-10 MED ORDER — LACTATED RINGERS IV SOLN
INTRAVENOUS | Status: DC
  Administered 2017-10-10 (×2): via INTRAVENOUS

## 2017-10-10 MED ORDER — ONDANSETRON HCL 4 MG PO TABS: 4.0000 mg | ORAL_TABLET | ORAL | Status: DC | PRN

## 2017-10-10 MED ORDER — ZOLPIDEM TARTRATE 5 MG PO TABS: 5.0000 mg | ORAL_TABLET | Freq: Every evening | ORAL | Status: DC | PRN

## 2017-10-10 MED ORDER — OXYTOCIN 40 UNITS IN LACTATED RINGERS INFUSION - SIMPLE MED
1.0000 m[IU]/min | INTRAVENOUS | Status: DC
  Administered 2017-10-10: 2 m[IU]/min via INTRAVENOUS

## 2017-10-10 MED ORDER — LIDOCAINE HCL (PF) 1 % IJ SOLN
INTRAMUSCULAR | Status: DC | PRN
  Administered 2017-10-10: 4 mL
  Administered 2017-10-10: 6 mL via EPIDURAL

## 2017-10-10 MED ORDER — OXYTOCIN BOLUS FROM INFUSION
500.0000 mL | Freq: Once | INTRAVENOUS | Status: AC
  Administered 2017-10-10: 500 mL via INTRAVENOUS

## 2017-10-10 NOTE — MAU Note (Signed)
PT SAYS SROM AT 1030PM-  VE IN OFFICE  -  2-3 CM.   DENIES HSV AND MRSA.  GBS- NEG

## 2017-10-10 NOTE — Anesthesia Procedure Notes (Signed)
Epidural Patient location during procedure: OB  Staffing Anesthesiologist: Nolton Denis, MD  Preanesthetic Checklist Completed: patient identified, pre-op evaluation, timeout performed, IV checked, risks and benefits discussed and monitors and equipment checked  Epidural Patient position: sitting Prep: DuraPrep Patient monitoring: blood pressure and continuous pulse ox Approach: right paramedian Location: L3-L4 Injection technique: LOR air  Needle:  Needle type: Tuohy  Needle gauge: 17 G Needle insertion depth: 4 cm Catheter type: closed end flexible Catheter size: 19 Gauge Catheter at skin depth: 10 cm Test dose: negative  Assessment Sensory level: T8  Additional Notes  Dosing of Epidural:  1st dose, through catheter .............................................  Xylocaine 40 mg  2nd dose, through catheter, after waiting 3 minutes.........Xylocaine 60 mg    As each dose occurred, patient was free of IV sx; and patient exhibited no evidence of SA injection.  Patient is more comfortable after epidural dosed. Please see RN's note for documentation of vital signs,and FHR which are stable.  Patient reminded not to try to ambulate with numb legs, and that an RN must be present when she attempts to get up.           

## 2017-10-10 NOTE — Anesthesia Postprocedure Evaluation (Signed)
Anesthesia Post Note  Patient: Joy Pena  Procedure(s) Performed: AN AD HOC LABOR EPIDURAL     Patient location during evaluation: Mother Baby Anesthesia Type: Epidural Level of consciousness: awake and alert, oriented and patient cooperative Pain management: pain level controlled Vital Signs Assessment: post-procedure vital signs reviewed and stable Respiratory status: spontaneous breathing Cardiovascular status: stable Postop Assessment: no headache, epidural receding, patient able to bend at knees and no signs of nausea or vomiting Anesthetic complications: no Comments: Pain score 5.  Pt states pain is manageable.    Last Vitals:  Vitals:   10/10/17 1115 10/10/17 1213  BP: 116/70 109/63  Pulse: 75 69  Resp: 18 18  Temp: 36.8 C 36.7 C    Last Pain:  Vitals:   10/10/17 1115  TempSrc:   PainSc: 0-No pain   Pain Goal:                 [facility]

## 2017-10-10 NOTE — H&P (Signed)
27 y.o. 7672w2d  [id number] comes in c/o SROM at 22:30, clear.  Otherwise has good fetal movement and no bleeding.  Past Medical History:  Diagnosis Date  . Hemorrhoid   . PONV (postoperative nausea and vomiting)   . UTI (lower urinary tract infection)   . Yeast infection of the vagina     Past Surgical History:  Procedure Laterality Date  . APPENDECTOMY    . BREAST SURGERY     Breast reduction  . TONSILLECTOMY    . WISDOM TOOTH EXTRACTION      OB History  Gravida Para Term Preterm AB Living  4 1 1   2 1   SAB TAB Ectopic Multiple Live Births  2     0 1    # Outcome Date GA Lbr Len/2nd Weight Sex Delivery Anes PTL Lv  4 Current           3 Term 08/25/15 6282w3d 19:01 / 01:00 7 lb 8.5 oz (3.415 kg) F Vag-Spont EPI  LIV     Birth Comments: none  2 SAB           1 SAB               Social History   Socioeconomic History  . Marital status: Married    Spouse name: Not on file  . Number of children: Not on file  . Years of education: Not on file  . Highest education level: Not on file  Social Needs  . Financial resource strain: Not on file  . Food insecurity - worry: Not on file  . Food insecurity - inability: Not on file  . Transportation needs - medical: Not on file  . Transportation needs - non-medical: Not on file  Occupational History  . Not on file  Tobacco Use  . Smoking status: Never Smoker  . Smokeless tobacco: Never Used  Substance and Sexual Activity  . Alcohol use: No    Comment: not while pregnant  . Drug use: No  . Sexual activity: Yes  Other Topics Concern  . Not on file  Social History Narrative  . Not on file   Patient has no known allergies.    Prenatal Transfer Tool  Maternal Diabetes: No Genetic Screening: Normal Maternal Ultrasounds/Referrals: Normal Fetal Ultrasounds or other Referrals:  None Maternal Substance Abuse:  No Significant Maternal Medications:  None Significant Maternal Lab Results: None  Other PNC:  uncomplicated.    Vitals:   10/10/17 0631 10/10/17 0730 10/10/17 0731 10/10/17 0759  BP: 113/72  115/72 112/74  Pulse: 77  81 77  Resp:  18  18  Temp:      TempSrc:      Weight:      Height:        Lungs/Cor:  NAD Abdomen:  soft, gravid Ex:  no cords, erythema SVE:  3/60/-2 FHTs:  120s, good STV, NST R Toco:  q 3-4    A/P   Term SROM, early labor.  GBS neg.  Joy Pena A

## 2017-10-10 NOTE — Anesthesia Preprocedure Evaluation (Signed)

## 2017-10-11 LAB — CBC
HEMATOCRIT: 24 % — AB (ref 36.0–46.0)
HEMOGLOBIN: 8.6 g/dL — AB (ref 12.0–15.0)
MCH: 28.3 pg (ref 26.0–34.0)
MCHC: 35.8 g/dL (ref 30.0–36.0)
MCV: 78.9 fL (ref 78.0–100.0)
Platelets: 210 10*3/uL (ref 150–400)
RBC: 3.04 MIL/uL — AB (ref 3.87–5.11)
RDW: 14.5 % (ref 11.5–15.5)
WBC: 11.4 10*3/uL — ABNORMAL HIGH (ref 4.0–10.5)

## 2017-10-11 NOTE — Progress Notes (Signed)
Patient is eating, ambulating, voiding.  Pain control is good.  Vitals:   10/10/17 1213 10/10/17 1548 10/10/17 2129 10/11/17 0600  BP: 109/63 (!) 107/59 107/71 (!) 98/56  Pulse: 69 69 78 64  Resp: 18 18 18 16   Temp: 98.1 F (36.7 C) 98.2 F (36.8 C) 98.9 F (37.2 C) 97.8 F (36.6 C)  TempSrc: Oral Oral Oral Oral  SpO2:   100% 100%  Weight:      Height:        Fundus firm Perineum without swelling.  Lab Results  Component Value Date   WBC 11.4 (H) 10/11/2017   HGB 8.6 (L) 10/11/2017   HCT 24.0 (L) 10/11/2017   MCV 78.9 10/11/2017   PLT 210 10/11/2017    --/--/O POS (03/08 0426)/RI  A/P Post partum day 1.  Routine care.  Expect d/c routine.  Iron for anemia and fall precautions.    Jourdyn Hasler A

## 2017-10-11 NOTE — Discharge Summary (Signed)
Obstetric Discharge Summary Reason for Admission: onset of labor Prenatal Procedures: none Intrapartum Procedures: spontaneous vaginal delivery Postpartum Procedures: none Complications-Operative and Postpartum: second degree labial tear Hemoglobin  Date Value Ref Range Status  10/11/2017 8.6 (L) 12.0 - 15.0 g/dL Final   HCT  Date Value Ref Range Status  10/11/2017 24.0 (L) 36.0 - 46.0 % Final     Discharge Diagnoses: Term Pregnancy-delivered  Discharge Information: Date: 10/11/2017 Activity: pelvic rest Diet: routine Medications: Ibuprofen and Iron Condition: stable Instructions: refer to practice specific booklet Discharge to: home Follow-up Information    Carrington ClampHorvath, Dontaye Hur, MD Follow up in 4 week(s).   Specialty:  Obstetrics and Gynecology Contact information: [address] [place] [address] [phone]           Newborn Data: Live born female  Birth Weight: 7 lb 2.1 oz (3235 g) APGAR: 9, 9  Newborn Delivery   Birth date/time:  10/10/2017 09:35:00 Delivery type:  Vaginal, Spontaneous     Home with mother.  Carlon Chaloux A 10/11/2017, 10:30 AM

## 2017-10-11 NOTE — Lactation Note (Signed)
This note was copied from a baby's chart. Lactation Consultation Note  Patient Name: [name] ZOXWR'UToday's Date: 10/11/2017   Mother called LC in room to check flange size of her personal DEBP. She requested smaller flanges #21 but LC demonstrated how #24 fit well. Mother states baby on phototherapy has not been waking for feeds. Unwrapped baby to wake and baby latched with sucks and swallows. Placed phototherapy light under baby while latched. Encouraged mother to post pump with her DEBP or she can ask RN for Medela DEBP. Recommend supplementing after each feeding w/ breastmilk or formula depending on availability. Mom encouraged to feed baby 8-12 times/24 hours and with feeding cues at least q 3 hours.        Maternal Data    Feeding    LATCH Score                   Interventions    Lactation Tools Discussed/Used     Consult Status      Hardie PulleyBerkelhammer, Jake Goodson Boschen 10/11/2017, 3:01 PM

## 2017-10-11 NOTE — Lactation Note (Signed)
This note was copied from a baby's chart. Lactation Consultation Note  Patient Name: Joy Pena GNFAO'ZToday's Date: 10/11/2017 Reason for consult: Initial assessment   P2.  Baby 23 hours old. Mother did not breastfeed her first child.  She had breast reduction in 2002.  Mother can easily express flow of colostrum. Mother states her nipples are sore, no trauma noted. Mother has coconut oil.  Suggest she alternate w/ comfort gels. Provided mother w/ manual pump.  She has DEBP at home. Recommend mother post pump 4-6 times per day for 10-20 min with DEBP on initiation setting to provide stimulation for her supply. Give baby back volume pumped at the next feeding. Reviewed cleaning and milk storage.  Referred mother to [email or web address] and compress breast during feedings and pumping. Mom encouraged to feed baby 8-12 times/24 hours and with feeding cues.  Call us if she starts having supply issues and for OP appt. Reviewed engorgement care and monitoring voids/stools.    Maternal Data    Feeding Feeding Type: Breast Fed Length of feed: 30 min  LATCH Score Latch: Grasps breast easily, tongue down, lips flanged, rhythmical sucking.  Audible Swallowing: A few with stimulation  Type of Nipple: Everted at rest and after stimulation  Comfort (Breast/Nipple): Filling, red/small blisters or bruises, mild/mod discomfort(coconut oil given, basics & positioning reinforced)  Hold (Positioning): Assistance needed to correctly position infant at breast and maintain latch.  LATCH Score: 7  Interventions Interventions: Breast feeding basics reviewed;Hand express;Breast compression;Hand pump  Lactation Tools Discussed/Used     Consult Status Consult Status: Complete    Hardie PulleyBerkelhammer, [name] 10/11/2017, 9:21 AM

## 2018-08-25 ENCOUNTER — Encounter (HOSPITAL_BASED_OUTPATIENT_CLINIC_OR_DEPARTMENT_OTHER): Payer: Self-pay | Admitting: *Deleted

## 2018-08-25 ENCOUNTER — Other Ambulatory Visit: Payer: Self-pay

## 2018-08-27 ENCOUNTER — Other Ambulatory Visit: Payer: Self-pay

## 2018-08-27 ENCOUNTER — Encounter (HOSPITAL_BASED_OUTPATIENT_CLINIC_OR_DEPARTMENT_OTHER): Payer: Self-pay

## 2018-08-27 ENCOUNTER — Ambulatory Visit (HOSPITAL_BASED_OUTPATIENT_CLINIC_OR_DEPARTMENT_OTHER): Payer: BLUE CROSS/BLUE SHIELD | Admitting: Anesthesiology

## 2018-08-27 ENCOUNTER — Ambulatory Visit (HOSPITAL_BASED_OUTPATIENT_CLINIC_OR_DEPARTMENT_OTHER)
Admission: RE | Admit: 2018-08-27 | Discharge: 2018-08-27 | Disposition: A | Discharge status: Home / Self Care | Payer: BLUE CROSS/BLUE SHIELD | Attending: Orthopaedic Surgery | Admitting: Orthopaedic Surgery

## 2018-08-27 ENCOUNTER — Encounter (HOSPITAL_BASED_OUTPATIENT_CLINIC_OR_DEPARTMENT_OTHER)
Admission: RE | Disposition: A | Discharge status: Home / Self Care | Payer: Self-pay | Source: Home / Self Care | Attending: Orthopaedic Surgery

## 2018-08-27 DIAGNOSIS — M24662 Ankylosis, left knee: Secondary | ICD-10-CM | POA: Insufficient documentation

## 2018-08-27 DIAGNOSIS — Z791 Long term (current) use of non-steroidal anti-inflammatories (NSAID): Secondary | ICD-10-CM | POA: Insufficient documentation

## 2018-08-27 HISTORY — PX: KNEE ARTHROSCOPY: SHX127

## 2018-08-27 SURGERY — ARTHROSCOPY, KNEE
Anesthesia: General | Site: Knee | Laterality: Left

## 2018-08-27 MED ORDER — DEXAMETHASONE SODIUM PHOSPHATE 10 MG/ML IJ SOLN
INTRAMUSCULAR | Status: AC
  Filled 2018-08-27: qty 1

## 2018-08-27 MED ORDER — HYDROMORPHONE HCL 1 MG/ML IJ SOLN
0.2500 mg | INTRAMUSCULAR | Status: DC | PRN
  Administered 2018-08-27: 0.5 mg via INTRAVENOUS

## 2018-08-27 MED ORDER — BUPIVACAINE HCL (PF) 0.25 % IJ SOLN
INTRAMUSCULAR | Status: DC | PRN
  Administered 2018-08-27: 20 mL

## 2018-08-27 MED ORDER — LIDOCAINE 2% (20 MG/ML) 5 ML SYRINGE
INTRAMUSCULAR | Status: AC
  Filled 2018-08-27: qty 5

## 2018-08-27 MED ORDER — OXYCODONE HCL 5 MG PO TABS
5.0000 mg | ORAL_TABLET | Freq: Once | ORAL | Status: AC | PRN
  Administered 2018-08-27: 5 mg via ORAL

## 2018-08-27 MED ORDER — ONDANSETRON HCL 4 MG/2ML IJ SOLN
INTRAMUSCULAR | Status: AC
  Filled 2018-08-27: qty 2

## 2018-08-27 MED ORDER — MIDAZOLAM HCL 2 MG/2ML IJ SOLN
1.0000 mg | INTRAMUSCULAR | Status: DC | PRN
  Administered 2018-08-27: 2 mg via INTRAVENOUS

## 2018-08-27 MED ORDER — ONDANSETRON HCL 4 MG/2ML IJ SOLN
INTRAMUSCULAR | Status: DC | PRN
  Administered 2018-08-27: 4 mg via INTRAVENOUS

## 2018-08-27 MED ORDER — MIDAZOLAM HCL 2 MG/2ML IJ SOLN
INTRAMUSCULAR | Status: AC
  Filled 2018-08-27: qty 2

## 2018-08-27 MED ORDER — LIDOCAINE 2% (20 MG/ML) 5 ML SYRINGE
INTRAMUSCULAR | Status: DC | PRN
  Administered 2018-08-27: 50 mg via INTRAVENOUS

## 2018-08-27 MED ORDER — HYDROMORPHONE HCL 1 MG/ML IJ SOLN
INTRAMUSCULAR | Status: AC
  Filled 2018-08-27: qty 0.5

## 2018-08-27 MED ORDER — CHLORHEXIDINE GLUCONATE 4 % EX LIQD: 60.0000 mL | Freq: Once | CUTANEOUS | Status: DC

## 2018-08-27 MED ORDER — PROPOFOL 500 MG/50ML IV EMUL
INTRAVENOUS | Status: AC
  Filled 2018-08-27: qty 50

## 2018-08-27 MED ORDER — PROMETHAZINE HCL 25 MG/ML IJ SOLN: 6.2500 mg | INTRAMUSCULAR | Status: DC | PRN

## 2018-08-27 MED ORDER — CEFAZOLIN SODIUM-DEXTROSE 2-4 GM/100ML-% IV SOLN
2.0000 g | INTRAVENOUS | Status: AC
  Administered 2018-08-27: 2 g via INTRAVENOUS

## 2018-08-27 MED ORDER — FENTANYL CITRATE (PF) 100 MCG/2ML IJ SOLN
INTRAMUSCULAR | Status: AC
  Filled 2018-08-27: qty 2

## 2018-08-27 MED ORDER — LIDOCAINE 2% (20 MG/ML) 5 ML SYRINGE: INTRAMUSCULAR | Status: DC | PRN

## 2018-08-27 MED ORDER — FENTANYL CITRATE (PF) 100 MCG/2ML IJ SOLN
50.0000 ug | INTRAMUSCULAR | Status: AC | PRN
  Administered 2018-08-27: 25 ug via INTRAVENOUS
  Administered 2018-08-27: 50 ug via INTRAVENOUS
  Administered 2018-08-27: 25 ug via INTRAVENOUS
  Administered 2018-08-27: 100 ug via INTRAVENOUS

## 2018-08-27 MED ORDER — OXYCODONE HCL 5 MG/5ML PO SOLN: 5.0000 mg | Freq: Once | ORAL | Status: AC | PRN

## 2018-08-27 MED ORDER — MEPERIDINE HCL 25 MG/ML IJ SOLN: 6.2500 mg | INTRAMUSCULAR | Status: DC | PRN

## 2018-08-27 MED ORDER — KETOROLAC TROMETHAMINE 30 MG/ML IJ SOLN
INTRAMUSCULAR | Status: DC | PRN
  Administered 2018-08-27: 30 mg via INTRAVENOUS

## 2018-08-27 MED ORDER — KETOROLAC TROMETHAMINE 30 MG/ML IJ SOLN
INTRAMUSCULAR | Status: AC
  Filled 2018-08-27: qty 1

## 2018-08-27 MED ORDER — ASPIRIN 81 MG PO TABS: 81.0000 mg | ORAL_TABLET | Freq: Every day | ORAL | 0 refills | Status: AC

## 2018-08-27 MED ORDER — ACETAMINOPHEN 500 MG PO TABS: 1000.0000 mg | ORAL_TABLET | Freq: Three times a day (TID) | ORAL | 0 refills | Status: AC

## 2018-08-27 MED ORDER — CEFAZOLIN SODIUM-DEXTROSE 2-4 GM/100ML-% IV SOLN
INTRAVENOUS | Status: AC
  Filled 2018-08-27: qty 100

## 2018-08-27 MED ORDER — OXYCODONE HCL 5 MG PO TABS
ORAL_TABLET | ORAL | Status: AC
  Filled 2018-08-27: qty 1

## 2018-08-27 MED ORDER — OXYCODONE HCL 5 MG PO TABS: ORAL_TABLET | ORAL | 0 refills | Status: AC

## 2018-08-27 MED ORDER — LACTATED RINGERS IV SOLN
INTRAVENOUS | Status: DC
  Administered 2018-08-27: 09:00:00 via INTRAVENOUS

## 2018-08-27 MED ORDER — SCOPOLAMINE 1 MG/3DAYS TD PT72
1.0000 | MEDICATED_PATCH | Freq: Once | TRANSDERMAL | Status: DC | PRN
  Administered 2018-08-27: 1.5 mg via TRANSDERMAL

## 2018-08-27 MED ORDER — PROPOFOL 10 MG/ML IV BOLUS
INTRAVENOUS | Status: DC | PRN
  Administered 2018-08-27: 150 mg via INTRAVENOUS

## 2018-08-27 MED ORDER — SODIUM CHLORIDE 0.9 % IR SOLN
Status: DC | PRN
  Administered 2018-08-27: 6000 mL

## 2018-08-27 MED ORDER — DEXAMETHASONE SODIUM PHOSPHATE 4 MG/ML IJ SOLN
INTRAMUSCULAR | Status: DC | PRN
  Administered 2018-08-27: 10 mg via INTRAVENOUS

## 2018-08-27 MED ORDER — ONDANSETRON HCL 4 MG PO TABS: 4.0000 mg | ORAL_TABLET | Freq: Three times a day (TID) | ORAL | 1 refills | Status: AC | PRN

## 2018-08-27 SURGICAL SUPPLY — 43 items
BANDAGE ACE 6X5 VEL STRL LF (GAUZE/BANDAGES/DRESSINGS) IMPLANT
BANDAGE ESMARK 6X9 LF (GAUZE/BANDAGES/DRESSINGS) IMPLANT
BLADE CLIPPER SURG (BLADE) IMPLANT
BLADE EXCALIBUR 4.0X13 (MISCELLANEOUS) ×2 IMPLANT
BNDG ESMARK 6X9 LF (GAUZE/BANDAGES/DRESSINGS)
CHLORAPREP W/TINT 26ML (MISCELLANEOUS) ×2 IMPLANT
CLSR STERI-STRIP ANTIMIC 1/2X4 (GAUZE/BANDAGES/DRESSINGS) ×2 IMPLANT
CUFF TOURN SGL QUICK 24 (TOURNIQUET CUFF) ×1
CUFF TOURNIQUET SINGLE 34IN LL (TOURNIQUET CUFF) IMPLANT
CUFF TRNQT CYL 24X4X16.5-23 (TOURNIQUET CUFF) ×1 IMPLANT
DISSECTOR 3.5MM X 13CM CVD (MISCELLANEOUS) IMPLANT
DISSECTOR 4.0MMX13CM CVD (MISCELLANEOUS) IMPLANT
DRAPE ARTHROSCOPY W/POUCH 90 (DRAPES) ×2 IMPLANT
DRAPE IMP U-DRAPE 54X76 (DRAPES) ×2 IMPLANT
DRAPE U-SHAPE 47X51 STRL (DRAPES) ×2 IMPLANT
GAUZE SPONGE 4X4 12PLY STRL (GAUZE/BANDAGES/DRESSINGS) ×2 IMPLANT
GAUZE XEROFORM 1X8 LF (GAUZE/BANDAGES/DRESSINGS) ×2 IMPLANT
GLOVE BIO SURGEON STRL SZ8 (GLOVE) ×2 IMPLANT
GLOVE BIOGEL PI IND STRL 7.0 (GLOVE) ×2 IMPLANT
GLOVE BIOGEL PI IND STRL 8 (GLOVE) ×2 IMPLANT
GLOVE BIOGEL PI INDICATOR 7.0 (GLOVE) ×2
GLOVE BIOGEL PI INDICATOR 8 (GLOVE) ×2
GLOVE ECLIPSE 6.5 STRL STRAW (GLOVE) ×2 IMPLANT
GLOVE ECLIPSE 8.0 STRL XLNG CF (GLOVE) ×4 IMPLANT
GOWN STRL REUS W/ TWL LRG LVL3 (GOWN DISPOSABLE) ×1 IMPLANT
GOWN STRL REUS W/TWL LRG LVL3 (GOWN DISPOSABLE) ×1
GOWN STRL REUS W/TWL XL LVL3 (GOWN DISPOSABLE) ×4 IMPLANT
KIT TURNOVER KIT B (KITS) ×2 IMPLANT
MANIFOLD NEPTUNE II (INSTRUMENTS) ×2 IMPLANT
NDL SAFETY ECLIPSE 18X1.5 (NEEDLE) ×1 IMPLANT
NEEDLE HYPO 18GX1.5 SHARP (NEEDLE) ×1
NS IRRIG 1000ML POUR BTL (IV SOLUTION) IMPLANT
PACK ARTHROSCOPY DSU (CUSTOM PROCEDURE TRAY) ×2 IMPLANT
PAD ARMBOARD 7.5X6 YLW CONV (MISCELLANEOUS) IMPLANT
PADDING CAST COTTON 6X4 STRL (CAST SUPPLIES) IMPLANT
PORT APPOLLO RF 90DEGREE MULTI (SURGICAL WAND) ×2 IMPLANT
PROBE BIPOLAR ATHRO 135MM 90D (MISCELLANEOUS) IMPLANT
SUT MNCRL AB 3-0 PS2 18 (SUTURE) IMPLANT
SUT MNCRL AB 4-0 PS2 18 (SUTURE) ×2 IMPLANT
SYR 5ML LUER SLIP (SYRINGE) IMPLANT
TOWEL GREEN STERILE FF (TOWEL DISPOSABLE) ×2 IMPLANT
TUBING ARTHROSCOPY IRRIG 16FT (MISCELLANEOUS) ×2 IMPLANT
WATER STERILE IRR 1000ML POUR (IV SOLUTION) IMPLANT

## 2018-08-27 NOTE — Op Note (Signed)
Orthopaedic Surgery Operative Note (CSN: [id number])  Joy Pena  12/22/1990 Date of Surgery: 08/27/2018   Diagnoses:  LEFT KNEE arthrofibrosis  Procedure: Left knee lysis of adhesions Left knee manipulation under anesthesia   Operative Finding Successful completion of planned procedure.  Patient's preop motion was 0 to 80 agrees with a hard stop at 80 degrees.  We performed diagnostic arthroscopy and noted the meniscus and the chondral surfaces to be intact throughout.  She had significant adhesions in the anterior interval as well as around the patella itself and in the peri-quadriceps region.  Superior pouch was lysed of adhesions as was the anterior interval.  A gentle release of some of the lateral tissue was performed taking care to avoid full release.  MPFL was intact and noted just above layer 1 as is typical.  Good stability of the patella was noted even at the end of the release.  Post-operative plan: The patient will be weightbearing as tolerated with aggressive range of motion and physical therapy starting after the case today.  The patient will be discharged home.  DVT prophylaxis aspirin for DVT prophylaxis.  Pain control with PRN pain medication preferring oral medicines.  Follow up plan will be scheduled in approximately 7 days for incision check.  Post-Op Diagnosis: Same Surgeons:Primary: Bjorn PippinVarkey, Caidyn Blossom T, MD Assistants: Janace LittenBrandon Parry, OPAC [facility] Anesthesia: General Antibiotics: Ancef 2g preop Tourniquet time:  Total Tourniquet Time Documented: Thigh (Left) - 26 minutes Total: Thigh (Left) - 26 minutes  Estimated Blood Loss: Minimal Complications: None Specimens: None Implants: * No implants in log *  Indications for Surgery:   Joy Pena is a 28 y.o. 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  Fortunately patient struggled with motion afterwards and had a hard stop to flexion.  We tried multiple  dynamic bracing options as well as aggressive physical therapy and injections and she failed to progress.  Once we felt that the tibial tubercle osteotomy was appropriately mature we felt that a manipulation and lysis of adhesions were appropriate.  Benefits and risks of operative and nonoperative management were discussed prior to surgery with patient/guardian(s) and informed consent form was completed.  Specific risks including infection, need for additional surgery, periprosthetic fracture, rupture of the extensor mechanism, continued stiffness and the need for further manipulations.   Procedure:   The patient was identified properly. Informed consent was obtained and the surgical site was marked. The patient was taken up to suite where general anesthesia was induced. The patient was placed in the supine position with a post against the surgical leg and a nonsterile tourniquet applied. The surgical leg was then prepped and draped usual sterile fashion.  A standard surgical timeout was performed.  2 standard anterior portals were made and diagnostic arthroscopy performed. Please note the findings as noted above.  We performed a release of the anterior interval scarring using a combination of a shaver and a RF ablator to obtain hemostasis.  Tourniquet was used for the above documented time.  After the anterior interval was thoroughly cleared with taking care not to over release around the portal sites we then proceeded to the suprapatellar pouch.  We released gently some medial tissue without taking down capsule as there was abundant scar in this area.  The MPFL was intact before and after release.  It appeared to be doing its job and not overly tight.  We then progressed proximally and released the superior aspect  of the suprapatellar pouch and were able to expose the medial lateral border of the quadriceps taking note that we were not inferior interfering with this tissue.  Once this was performed we  performed a partial lateral release to dissect out scar rather than capsule itself.  This point we cleared the rest of the knee including the notch as well as the medial lateral compartments which all look to be relatively normal.  We then performed a gentle manipulation and documented this with video which demonstrated 0 to 135 degrees of motion with a good endpoint on translation of the patella.  We went back into the joint with arthroscope and examine the MPFL and noted that was still intact.  Incisions closed with absorbable suture. The patient was awoken from general anesthesia and taken to the PACU in stable condition without complication.   Janace Litten, OPA-C, present and scrubbed throughout the case, critical for completion in a timely fashion, and for retraction, instrumentation, closure.

## 2018-08-27 NOTE — Transfer of Care (Signed)
Immediate Anesthesia Transfer of Care Note  Patient: MAKENZYE ENGLEHART  Procedure(s) Performed: ARTHROSCOPY KNEE WITH LYSIS OF ADHESIONS AND MANIPULATION (Left Knee)  Patient Location: PACU  Anesthesia Type:General  Level of Consciousness: awake and sedated  Airway & Oxygen Therapy: Patient Spontanous Breathing and Patient connected to face mask oxygen  Post-op Assessment: Report given to RN and Post -op Vital signs reviewed and stable  Post vital signs: Reviewed and stable  Last Vitals:  Vitals Value Taken Time  BP    Temp    Pulse 83 08/27/2018 10:36 AM  Resp 12 08/27/2018 10:36 AM  SpO2 98 % 08/27/2018 10:36 AM  Vitals shown include unvalidated device data.  Last Pain:  Vitals:   08/27/18 0840  TempSrc: Oral  PainSc: 0-No pain         Complications: No apparent anesthesia complications

## 2018-08-27 NOTE — Anesthesia Postprocedure Evaluation (Signed)
Anesthesia Post Note  Patient: Joy Pena  Procedure(s) Performed: ARTHROSCOPY KNEE WITH LYSIS OF ADHESIONS AND MANIPULATION (Left Knee)     Patient location during evaluation: PACU Anesthesia Type: General Level of consciousness: awake and alert Pain management: pain level controlled Vital Signs Assessment: post-procedure vital signs reviewed and stable Respiratory status: spontaneous breathing, nonlabored ventilation and respiratory function stable Cardiovascular status: blood pressure returned to baseline and stable Postop Assessment: no apparent nausea or vomiting Anesthetic complications: no    Last Vitals:  Vitals:   08/27/18 1130 08/27/18 1200  BP:  130/87  Pulse: 95 72  Resp: 13 16  Temp:  36.4 C  SpO2: 100% 100%    Last Pain:  Vitals:   08/27/18 1200  TempSrc:   PainSc: 4                  Lowella Curb

## 2018-08-27 NOTE — Discharge Instructions (Signed)

## 2018-08-27 NOTE — Anesthesia Preprocedure Evaluation (Signed)
Anesthesia Evaluation  Patient identified by MRN, date of birth, ID band Patient awake    Reviewed: Allergy & Precautions, H&P , NPO status , Patient's Chart, lab work & pertinent test results  History of Anesthesia Complications (+) PONV  Airway Mallampati: II  TM Distance: >3 FB Neck ROM: full    Dental  (+) Teeth Intact   Pulmonary neg pulmonary ROS,    breath sounds clear to auscultation       Cardiovascular negative cardio ROS   Rhythm:regular Rate:Normal     Neuro/Psych negative neurological ROS  negative psych ROS   GI/Hepatic negative GI ROS, Neg liver ROS,   Endo/Other  negative endocrine ROS  Renal/GU negative Renal ROS  negative genitourinary   Musculoskeletal negative musculoskeletal ROS (+)   Abdominal   Peds negative pediatric ROS (+)  Hematology negative hematology ROS (+)   Anesthesia Other Findings       Reproductive/Obstetrics negative OB ROS                             Anesthesia Physical  Anesthesia Plan  ASA: I  Anesthesia Plan: General   Post-op Pain Management:    Induction: Intravenous  PONV Risk Score and Plan: 4 or greater and Ondansetron, Dexamethasone, Midazolam and Scopolamine patch - Pre-op  Airway Management Planned: LMA  Additional Equipment:   Intra-op Plan:   Post-operative Plan: Extubation in OR  Informed Consent: I have reviewed the patients History and Physical, chart, labs and discussed the procedure including the risks, benefits and alternatives for the proposed anesthesia with the patient or authorized representative who has indicated his/her understanding and acceptance.     Dental Advisory Given  Plan Discussed with: CRNA  Anesthesia Plan Comments:         Anesthesia Quick Evaluation

## 2018-08-27 NOTE — H&P (Signed)
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 Symptoms are rated as moderate to severe, and have been worsening.  This is significantly impairing activities of daily living.  Please see my clinic note for full details on this patient's care.  She has elected for surgical management.   Past Medical History:  Diagnosis Date  . Hemorrhoid   . PONV (postoperative nausea and vomiting)   . UTI (lower urinary tract infection)    Past Surgical History:  Procedure Laterality Date  . APPENDECTOMY    . BREAST SURGERY     Breast reduction  . TONSILLECTOMY    . WISDOM TOOTH EXTRACTION     Social History   Socioeconomic History  . Marital status: Married    Spouse name: Matt  . Number of children: Not on file  . Years of education: Not on file  . Highest education level: Not on file  Occupational History  . Not on file  Social Needs  . Financial resource strain: Not on file  . Food insecurity:    Worry: Not on file    Inability: Not on file  . Transportation needs:    Medical: Not on file    Non-medical: Not on file  Tobacco Use  . Smoking status: Never Smoker  . Smokeless tobacco: Never Used  Substance and Sexual Activity  . Alcohol use: No    Comment: not while pregnant  . Drug use: No  . Sexual activity: Yes    Birth control/protection: I.U.D.  Lifestyle  . Physical activity:    Days per week: Not on file    Minutes per session: Not on file  . Stress: Not on file  Relationships  . Social connections:    Talks on phone: Not on file    Gets together: Not on file    Attends religious service: Not on file    Active member of club or organization: Not on file    Attends meetings of clubs or organizations: Not on file    Relationship status: Not on file  Other Topics Concern  . Not on file  Social History Narrative   . Not on file   Family History  Problem Relation Age of Onset  . Cancer Maternal Grandfather    No Known Allergies Prior to Admission medications   Medication Sig Start Date End Date Taking? Authorizing Provider  celecoxib (CELEBREX) 200 MG capsule Take 200 mg by mouth 2 (two) times daily.   Yes [provider]     Positive ROS: All other systems have been reviewed and were otherwise negative with the exception of those mentioned in the HPI and as above.  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   Discussed risk of extensor mechanism disruption, fracture or NV compromise.  Bjorn Pippin, MD  08/27/2018 8:24 AM

## 2018-08-27 NOTE — Anesthesia Procedure Notes (Signed)
Procedure Name: LMA Insertion Performed by: Leilynn Pilat W, CRNA Pre-anesthesia Checklist: Patient identified, Emergency Drugs available, Suction available and Patient being monitored Patient Re-evaluated:Patient Re-evaluated prior to induction Oxygen Delivery Method: Circle system utilized Preoxygenation: Pre-oxygenation with 100% oxygen Induction Type: IV induction Ventilation: Mask ventilation without difficulty LMA: LMA inserted LMA Size: 4.0 Number of attempts: 1 Placement Confirmation: positive ETCO2 Tube secured with: Tape Dental Injury: Teeth and Oropharynx as per pre-operative assessment        

## 2018-08-28 ENCOUNTER — Encounter (HOSPITAL_BASED_OUTPATIENT_CLINIC_OR_DEPARTMENT_OTHER): Payer: Self-pay | Admitting: Orthopaedic Surgery

## 2018-10-16 ENCOUNTER — Other Ambulatory Visit: Payer: Self-pay

## 2018-10-16 ENCOUNTER — Encounter (HOSPITAL_BASED_OUTPATIENT_CLINIC_OR_DEPARTMENT_OTHER): Payer: Self-pay | Admitting: *Deleted

## 2018-10-22 ENCOUNTER — Ambulatory Visit (HOSPITAL_BASED_OUTPATIENT_CLINIC_OR_DEPARTMENT_OTHER): Admission: RE | Admit: 2018-10-22 | Payer: Self-pay | Source: Home / Self Care | Admitting: Orthopaedic Surgery

## 2018-10-22 SURGERY — CLOSED MANIPULATION KNEE WITH STEROID INJECTION
Anesthesia: Choice | Laterality: Left

## 2018-10-26 ENCOUNTER — Encounter (HOSPITAL_BASED_OUTPATIENT_CLINIC_OR_DEPARTMENT_OTHER): Payer: Self-pay | Admitting: *Deleted

## 2018-10-26 ENCOUNTER — Other Ambulatory Visit: Payer: Self-pay

## 2018-10-29 ENCOUNTER — Ambulatory Visit (HOSPITAL_BASED_OUTPATIENT_CLINIC_OR_DEPARTMENT_OTHER): Payer: Self-pay | Admitting: Anesthesiology

## 2018-10-29 ENCOUNTER — Other Ambulatory Visit: Payer: Self-pay

## 2018-10-29 ENCOUNTER — Encounter (HOSPITAL_BASED_OUTPATIENT_CLINIC_OR_DEPARTMENT_OTHER): Payer: Self-pay | Admitting: *Deleted

## 2018-10-29 ENCOUNTER — Ambulatory Visit (HOSPITAL_BASED_OUTPATIENT_CLINIC_OR_DEPARTMENT_OTHER)
Admission: RE | Admit: 2018-10-29 | Discharge: 2018-10-29 | Disposition: A | Discharge status: Home / Self Care | Payer: Self-pay | Attending: Orthopaedic Surgery | Admitting: Orthopaedic Surgery

## 2018-10-29 ENCOUNTER — Encounter (HOSPITAL_BASED_OUTPATIENT_CLINIC_OR_DEPARTMENT_OTHER)
Admission: RE | Disposition: A | Discharge status: Home / Self Care | Payer: Self-pay | Source: Home / Self Care | Attending: Orthopaedic Surgery

## 2018-10-29 DIAGNOSIS — M24662 Ankylosis, left knee: Secondary | ICD-10-CM | POA: Insufficient documentation

## 2018-10-29 DIAGNOSIS — M1712 Unilateral primary osteoarthritis, left knee: Secondary | ICD-10-CM | POA: Insufficient documentation

## 2018-10-29 DIAGNOSIS — Z975 Presence of (intrauterine) contraceptive device: Secondary | ICD-10-CM | POA: Insufficient documentation

## 2018-10-29 HISTORY — PX: CLOSED MANIPULATION KNEE WITH STERIOD INJECTION: SHX5610

## 2018-10-29 SURGERY — CLOSED MANIPULATION KNEE WITH STEROID INJECTION
Anesthesia: General | Site: Knee | Laterality: Left

## 2018-10-29 MED ORDER — OXYCODONE HCL 5 MG PO TABS: ORAL_TABLET | ORAL | 0 refills | Status: AC

## 2018-10-29 MED ORDER — CHLORHEXIDINE GLUCONATE 4 % EX LIQD: 60.0000 mL | Freq: Once | CUTANEOUS | Status: DC

## 2018-10-29 MED ORDER — PROPOFOL 500 MG/50ML IV EMUL
INTRAVENOUS | Status: AC
  Filled 2018-10-29: qty 50

## 2018-10-29 MED ORDER — DEXAMETHASONE SODIUM PHOSPHATE 10 MG/ML IJ SOLN
INTRAMUSCULAR | Status: AC
  Filled 2018-10-29: qty 1

## 2018-10-29 MED ORDER — BUPIVACAINE HCL (PF) 0.25 % IJ SOLN
INTRAMUSCULAR | Status: DC | PRN
  Administered 2018-10-29: 3 mL

## 2018-10-29 MED ORDER — MIDAZOLAM HCL 2 MG/2ML IJ SOLN
1.0000 mg | INTRAMUSCULAR | Status: DC | PRN
  Administered 2018-10-29: 2 mg via INTRAVENOUS

## 2018-10-29 MED ORDER — FENTANYL CITRATE (PF) 100 MCG/2ML IJ SOLN
INTRAMUSCULAR | Status: AC
  Filled 2018-10-29: qty 2

## 2018-10-29 MED ORDER — MELOXICAM 7.5 MG PO TABS: 7.5000 mg | ORAL_TABLET | Freq: Every day | ORAL | 2 refills | Status: AC

## 2018-10-29 MED ORDER — SCOPOLAMINE 1 MG/3DAYS TD PT72
1.0000 | MEDICATED_PATCH | Freq: Once | TRANSDERMAL | Status: DC | PRN
  Administered 2018-10-29: 1.5 mg via TRANSDERMAL

## 2018-10-29 MED ORDER — SUCCINYLCHOLINE CHLORIDE 200 MG/10ML IV SOSY
PREFILLED_SYRINGE | INTRAVENOUS | Status: AC
  Filled 2018-10-29: qty 10

## 2018-10-29 MED ORDER — ONDANSETRON HCL 4 MG/2ML IJ SOLN
INTRAMUSCULAR | Status: AC
  Filled 2018-10-29: qty 2

## 2018-10-29 MED ORDER — ONDANSETRON HCL 4 MG PO TABS: 4.0000 mg | ORAL_TABLET | Freq: Three times a day (TID) | ORAL | 1 refills | Status: AC | PRN

## 2018-10-29 MED ORDER — METHYLPREDNISOLONE ACETATE 80 MG/ML IJ SUSP
INTRAMUSCULAR | Status: AC
  Filled 2018-10-29: qty 1

## 2018-10-29 MED ORDER — LIDOCAINE 2% (20 MG/ML) 5 ML SYRINGE
INTRAMUSCULAR | Status: AC
  Filled 2018-10-29: qty 5

## 2018-10-29 MED ORDER — FENTANYL CITRATE (PF) 100 MCG/2ML IJ SOLN
25.0000 ug | INTRAMUSCULAR | Status: DC | PRN
  Administered 2018-10-29: 50 ug via INTRAVENOUS

## 2018-10-29 MED ORDER — MIDAZOLAM HCL 2 MG/2ML IJ SOLN
INTRAMUSCULAR | Status: AC
  Filled 2018-10-29: qty 2

## 2018-10-29 MED ORDER — ACETAMINOPHEN 500 MG PO TABS: 1000.0000 mg | ORAL_TABLET | Freq: Three times a day (TID) | ORAL | 0 refills | Status: AC

## 2018-10-29 MED ORDER — PHENYLEPHRINE 40 MCG/ML (10ML) SYRINGE FOR IV PUSH (FOR BLOOD PRESSURE SUPPORT)
PREFILLED_SYRINGE | INTRAVENOUS | Status: AC
  Filled 2018-10-29: qty 10

## 2018-10-29 MED ORDER — METHYLPREDNISOLONE ACETATE 80 MG/ML IJ SUSP
INTRAMUSCULAR | Status: DC | PRN
  Administered 2018-10-29: 80 mg

## 2018-10-29 MED ORDER — EPHEDRINE 5 MG/ML INJ
INTRAVENOUS | Status: AC
  Filled 2018-10-29: qty 10

## 2018-10-29 MED ORDER — PROPOFOL 10 MG/ML IV BOLUS
INTRAVENOUS | Status: DC | PRN
  Administered 2018-10-29: 150 mg via INTRAVENOUS

## 2018-10-29 MED ORDER — BUPIVACAINE HCL (PF) 0.25 % IJ SOLN
INTRAMUSCULAR | Status: AC
  Filled 2018-10-29: qty 30

## 2018-10-29 MED ORDER — FENTANYL CITRATE (PF) 100 MCG/2ML IJ SOLN
50.0000 ug | INTRAMUSCULAR | Status: AC | PRN
  Administered 2018-10-29: 100 ug via INTRAVENOUS
  Administered 2018-10-29 (×2): 50 ug via INTRAVENOUS

## 2018-10-29 MED ORDER — LACTATED RINGERS IV SOLN
INTRAVENOUS | Status: DC
  Administered 2018-10-29 (×2): via INTRAVENOUS

## 2018-10-29 SURGICAL SUPPLY — 17 items
DECANTER SPIKE VIAL GLASS SM (MISCELLANEOUS) IMPLANT
GLOVE BIOGEL PI IND STRL 7.0 (GLOVE) IMPLANT
GLOVE BIOGEL PI IND STRL 8 (GLOVE) IMPLANT
GLOVE BIOGEL PI INDICATOR 7.0 (GLOVE)
GLOVE BIOGEL PI INDICATOR 8 (GLOVE)
GLOVE ECLIPSE 8.0 STRL XLNG CF (GLOVE) IMPLANT
GOWN STRL REUS W/ TWL LRG LVL3 (GOWN DISPOSABLE) IMPLANT
GOWN STRL REUS W/TWL LRG LVL3 (GOWN DISPOSABLE)
GOWN STRL REUS W/TWL XL LVL3 (GOWN DISPOSABLE) IMPLANT
KNEE WRAP E Z 3 GEL PACK (MISCELLANEOUS) IMPLANT
NDL SAFETY ECLIPSE 18X1.5 (NEEDLE) ×1 IMPLANT
NEEDLE HYPO 18GX1.5 SHARP (NEEDLE) ×2
NEEDLE HYPO 22GX1.5 SAFETY (NEEDLE) ×3 IMPLANT
NEEDLE SPNL 22GX3.5 QUINCKE BK (NEEDLE) IMPLANT
PAD ALCOHOL SWAB (MISCELLANEOUS) ×6 IMPLANT
SWABSTICK POVIDONE IODINE SNGL (MISCELLANEOUS) IMPLANT
SYR CONTROL 10ML LL (SYRINGE) ×3 IMPLANT

## 2018-10-29 NOTE — Transfer of Care (Signed)
Immediate Anesthesia Transfer of Care Note  Patient: Joy Pena  Procedure(s) Performed: CLOSED MANIPULATION KNEE WITH STEROID INJECTION (Left Knee)  Patient Location: PACU  Anesthesia Type:General  Level of Consciousness: sedated  Airway & Oxygen Therapy: Patient Spontanous Breathing and Patient connected to face mask oxygen  Post-op Assessment: Report given to RN and Post -op Vital signs reviewed and stable  Post vital signs: Reviewed and stable  Last Vitals:  Vitals Value Taken Time  BP    Temp    Pulse    Resp 20 10/29/2018  9:47 AM  SpO2    Vitals shown include unvalidated device data.  Last Pain:  Vitals:   10/29/18 0819  TempSrc: Oral  PainSc: 0-No pain         Complications: No apparent anesthesia complications

## 2018-10-29 NOTE — Anesthesia Preprocedure Evaluation (Addendum)
Anesthesia Evaluation  Patient identified by MRN, date of birth, ID band Patient awake    Reviewed: Allergy & Precautions, NPO status , Patient's Chart, lab work & pertinent test results  Airway Mallampati: II  TM Distance: >3 FB     Dental   Pulmonary    breath sounds clear to auscultation       Cardiovascular negative cardio ROS   Rhythm:Regular Rate:Normal     Neuro/Psych    GI/Hepatic negative GI ROS, Neg liver ROS,   Endo/Other    Renal/GU negative Renal ROS     Musculoskeletal   Abdominal   Peds  Hematology   Anesthesia Other Findings   Reproductive/Obstetrics                             Anesthesia Physical Anesthesia Plan  ASA: II  Anesthesia Plan: General   Post-op Pain Management:  Regional for Post-op pain   Induction: Intravenous  PONV Risk Score and Plan: Ondansetron, Dexamethasone and Treatment may vary due to age or medical condition  Airway Management Planned: Mask  Additional Equipment:   Intra-op Plan:   Post-operative Plan:   Informed Consent: I have reviewed the patients History and Physical, chart, labs and discussed the procedure including the risks, benefits and alternatives for the proposed anesthesia with the patient or authorized representative who has indicated his/her understanding and acceptance.     Dental advisory given  Plan Discussed with: CRNA and Anesthesiologist  Anesthesia Plan Comments:        Anesthesia Quick Evaluation

## 2018-10-29 NOTE — Anesthesia Procedure Notes (Signed)
Anesthesia Regional Block: Adductor canal block   Pre-Anesthetic Checklist: ,, timeout performed, Correct Patient, Correct Site, Correct Laterality, Correct Procedure, Correct Position, site marked, Risks and benefits discussed,  Surgical consent,  Pre-op evaluation,  At surgeon's request and post-op pain management  Laterality: Left  Prep: chloraprep       Needles:   Needle Type: Other          Additional Needles:   Procedures: Doppler guided,,,, ultrasound used (permanent image in chart),,,,  Narrative:  Start time: 10/29/2018 8:45 AM End time: 10/29/2018 9:00 AM Injection made incrementally with aspirations every 5 mL.  Performed by: Personally  Anesthesiologist: Dorris Singh, MD

## 2018-10-29 NOTE — Progress Notes (Signed)
AssistedDr. Edwards with left, ultrasound guided, adductor canal block. Side rails up, monitors on throughout procedure. See vital signs in flow sheet. Tolerated Procedure well.  

## 2018-10-29 NOTE — H&P (Signed)
PREOPERATIVE H&P  Chief Complaint: ANKYLOSIS LEFT KNEE,OSTEOARTHRITIS  HPI: Joy Pena is a 28 y.o. female who presents for preoperative history and physical with a diagnosis of ANKYLOSIS LEFT KNEE,OSTEOARTHRITIS. Symptoms are rated as moderate to severe, and have been worsening.  This is significantly impairing activities of daily living.  Please see my clinic note for full details on this patient's care.  She has elected for surgical management.   Past Medical History:  Diagnosis Date  . Hemorrhoid   . PONV (postoperative nausea and vomiting)   . UTI (lower urinary tract infection)    Past Surgical History:  Procedure Laterality Date  . APPENDECTOMY    . BREAST SURGERY     Breast reduction  . KNEE ARTHROSCOPY Left 08/27/2018   Procedure: ARTHROSCOPY KNEE WITH LYSIS OF ADHESIONS AND MANIPULATION;  Surgeon: Bjorn Pippin, MD;  Location: [facility];  Service: Orthopedics;  Laterality: Left;  . TONSILLECTOMY    . WISDOM TOOTH EXTRACTION     Social History   Socioeconomic History  . Marital status: Married    Spouse name: Matt  . Number of children: Not on file  . Years of education: Not on file  . Highest education level: Not on file  Occupational History  . Not on file  Social Needs  . Financial resource strain: Not on file  . Food insecurity:    Worry: Not on file    Inability: Not on file  . Transportation needs:    Medical: Not on file    Non-medical: Not on file  Tobacco Use  . Smoking status: Never Smoker  . Smokeless tobacco: Never Used  Substance and Sexual Activity  . Alcohol use: No  . Drug use: No  . Sexual activity: Yes    Birth control/protection: I.U.D.  Lifestyle  . Physical activity:    Days per week: Not on file    Minutes per session: Not on file  . Stress: Not on file  Relationships  . Social connections:    Talks on phone: Not on file    Gets together: Not on file    Attends religious service: Not on file    Active member  of club or organization: Not on file    Attends meetings of clubs or organizations: Not on file    Relationship status: Not on file  Other Topics Concern  . Not on file  Social History Narrative  . Not on file   Family History  Problem Relation Age of Onset  . Cancer Maternal Grandfather    No Known Allergies Prior to Admission medications   Medication Sig Start Date End Date Taking? Authorizing Provider  levonorgestrel (MIRENA) 20 MCG/24HR IUD 1 each by Intrauterine route once.    [provider]     Positive ROS: All other systems have been reviewed and were otherwise negative with the exception of those mentioned in the HPI and as above.  Physical Exam: General: Alert, no acute distress Cardiovascular: No pedal edema Respiratory: No cyanosis, no use of accessory musculature GI: No organomegaly, abdomen is soft and non-tender Skin: No lesions in the area of chief complaint Neurologic: Sensation intact distally Psychiatric: Patient is competent for consent with normal mood and affect Lymphatic: No axillary or cervical lymphadenopathy  MUSCULOSKELETAL: L knee stiffness, 90 flexion  Assessment: ANKYLOSIS LEFT KNEE,OSTEOARTHRITIS  Plan: Plan for Procedure(s): CLOSED MANIPULATION KNEE WITH STEROID INJECTION  The risks benefits and alternatives were discussed with the patient including but not  limited to the risks of nonoperative treatment, versus surgical intervention including infection, bleeding, nerve injury,  blood clots, cardiopulmonary complications, morbidity, mortality, among others, and they were willing to proceed.   Bjorn Pippin, MD  10/29/2018 9:10 AM

## 2018-10-29 NOTE — Op Note (Signed)
Orthopaedic Surgery Operative Note (CSN: [id number])  Joy Pena  Jul 13, 1991 Date of Surgery: 10/29/2018   Diagnoses:  arthrofibrosis left knee  Procedure: Left knee closed manipulation under anesthesia Left knee injection of Depo-Medrol under anesthesia   Operative Finding Successful completion of planned procedure.  EUA initially was 0-90, with gentle manipulation we achieved 0-145 similar to contralateral.  Post-operative plan: The patient will be weightbearing as tolerated with aggressive work on range of motion on her own as well as physical therapy.  The patient will be discharged home.  DVT prophylaxis not indicated ambulatory extremity patient with manipulation only.  Pain control with PRN pain medication preferring oral medicines.  Follow up plan will be scheduled in approximately 7 days.  Post-Op Diagnosis: Same Surgeons:Primary: Bjorn Pippin, MD Assistants: Janace Litten OPAC Location: [facility] OR ROOM 5 Anesthesia: Choice Antibiotics: Not indicated Tourniquet time: * No tourniquets in log * Estimated Blood Loss: none Complications: None Specimens: None Implants: * No implants in log *  Indications for Surgery:   Joy Pena is a 28 y.o. female with previous history of a proximal distal realignment for patellar instability.  She continued to have stiffness and we did a lysis of adhesions and manipulation under anesthesia.  She continued to have more stiffness and we felt that a manipulation again without opening the knee would be appropriate.  Benefits and risks of operative and nonoperative management were discussed prior to surgery with patient/guardian(s) and informed consent form was completed.  Specific risks including infection, need for additional surgery, rupture of her patellar tendon or extensor mechanism, skin tear, fracture.   Procedure:   The patient was identified in the preoperative holding area where the surgical site was marked. The patient was taken  to the OR where a procedural timeout was called and the above noted anesthesia was induced.  Timeout was called but no incision was made.  No prep was needed.  To manipulation was performed under anesthesia and we able achieve 135 degrees of flexion.  This was nearly symmetric to the contralateral side.  The patient had full extension as well.  Depo-Medrol was injected under sterile fashion from the superior lateral portal.  The patient was awoken from general anesthesia and taken to the PACU in stable condition without complication.    Janace Litten, OPA-C, present and scrubbed throughout the case, critical for completion in a timely fashion, and for retraction, instrumentation, closure.

## 2018-10-29 NOTE — Anesthesia Postprocedure Evaluation (Signed)
Anesthesia Post Note  Patient: Joy Pena  Procedure(s) Performed: CLOSED MANIPULATION KNEE WITH STEROID INJECTION (Left Knee)     Patient location during evaluation: PACU Anesthesia Type: General Level of consciousness: awake Pain management: pain level controlled Vital Signs Assessment: post-procedure vital signs reviewed and stable Respiratory status: spontaneous breathing Cardiovascular status: stable Postop Assessment: no apparent nausea or vomiting Anesthetic complications: no    Last Vitals:  Vitals:   10/29/18 1045 10/29/18 1115  BP: 120/89 (!) 133/92  Pulse:  90  Resp:  16  Temp:  36.9 C  SpO2:  100%    Last Pain:  Vitals:   10/29/18 1115  TempSrc:   PainSc: 4                  Ezzard Ditmer

## 2018-10-29 NOTE — Anesthesia Procedure Notes (Signed)
Anesthesia Regional Block: Adductor canal block   Pre-Anesthetic Checklist: ,, timeout performed, Correct Patient, Correct Site, Correct Laterality, Correct Procedure, Correct Position, site marked, Risks and benefits discussed, Surgical consent,  Pre-op evaluation,  At surgeon's request  Laterality: Left  Prep: chloraprep       Needles:  Injection technique: Single-shot  Needle Type: Echogenic Stimulator Needle          Additional Needles:   Procedures: Doppler guided,,,, ultrasound used (permanent image in chart),,,,  Narrative:  Start time: 10/29/2018 8:45 AM End time: 10/29/2018 9:00 AM Injection made incrementally with aspirations every 5 mL.  Performed by: Personally  Anesthesiologist: Dorris Singh, MD

## 2018-10-29 NOTE — Discharge Instructions (Signed)

## 2018-10-30 ENCOUNTER — Encounter (HOSPITAL_BASED_OUTPATIENT_CLINIC_OR_DEPARTMENT_OTHER): Payer: Self-pay | Admitting: Orthopaedic Surgery

## 2018-10-30 NOTE — Addendum Note (Signed)
Addendum  created 10/30/18 1026 by Caren Macadam, CRNA   Charge Capture section accepted

## 2020-07-07 ENCOUNTER — Other Ambulatory Visit: Payer: Self-pay | Admitting: Obstetrics and Gynecology

## 2020-07-07 DIAGNOSIS — Z3682 Encounter for antenatal screening for nuchal translucency: Secondary | ICD-10-CM

## 2020-07-07 DIAGNOSIS — Z3A13 13 weeks gestation of pregnancy: Secondary | ICD-10-CM

## 2020-07-19 LAB — OB RESULTS CONSOLE HIV ANTIBODY (ROUTINE TESTING): HIV: NONREACTIVE

## 2020-07-19 LAB — OB RESULTS CONSOLE RUBELLA ANTIBODY, IGM: Rubella: IMMUNE

## 2020-07-19 LAB — OB RESULTS CONSOLE GC/CHLAMYDIA
Chlamydia: NEGATIVE
Gonorrhea: NEGATIVE

## 2020-07-19 LAB — OB RESULTS CONSOLE ANTIBODY SCREEN: Antibody Screen: NEGATIVE

## 2020-07-19 LAB — OB RESULTS CONSOLE ABO/RH: "RH Type ": POSITIVE

## 2020-07-19 LAB — OB RESULTS CONSOLE RPR: RPR: NONREACTIVE

## 2020-07-19 LAB — OB RESULTS CONSOLE HEPATITIS B SURFACE ANTIGEN: Hepatitis B Surface Ag: NEGATIVE

## 2020-08-05 NOTE — L&D Delivery Note (Addendum)
Delivery Note Joy Pena is a [id number] at [redacted]w[redacted]d who had a spontaneous delivery at 1701 a viable female was delivered via occiput posterior.  APGAR: 8,9 ; weight 8lb2.7oz  .    Admitted for elective induction of labor. Induced with pitocin and AROM. Progressed normally. Received epidural for pain management. Pushed for 15 minutes. Baby was delivered without difficulty. Loose nuchal cord x 1. Placenta delivered spontaneously and was found to be intact , 3 -vessel cord was noted. The fundus was found to be firm. 2nd degree perineal laceration was repaired in the normal fashion with 3-0 vicryl.  Estimated blood loss 150cc.  Instrument and gauze counts were correct at the end of the procedure.   Placenta status: to L&D for disposal.    Anesthesia: epidural   Episiotomy:   none Lacerations:  2nd degree Suture Repair: 3.0 vicryl Est. Blood Loss (mL):   Mom to postpartum.  Baby to Couplet care / Skin to Skin.  [name] 02/19/2021, 5:26 PM

## 2020-08-15 ENCOUNTER — Encounter: Payer: Self-pay | Admitting: *Deleted

## 2020-08-16 ENCOUNTER — Encounter: Payer: Self-pay | Admitting: *Deleted

## 2020-08-16 ENCOUNTER — Ambulatory Visit: Payer: BC Managed Care – PPO | Admitting: *Deleted

## 2020-08-16 ENCOUNTER — Ambulatory Visit: Payer: BC Managed Care – PPO | Attending: Obstetrics and Gynecology

## 2020-08-16 ENCOUNTER — Other Ambulatory Visit: Payer: Self-pay | Admitting: *Deleted

## 2020-08-16 ENCOUNTER — Ambulatory Visit: Payer: BC Managed Care – PPO

## 2020-08-16 ENCOUNTER — Other Ambulatory Visit: Payer: Self-pay

## 2020-08-16 VITALS — BP 116/74 | HR 82 | Wt 140.0 lb

## 2020-08-16 DIAGNOSIS — Z3689 Encounter for other specified antenatal screening: Secondary | ICD-10-CM

## 2020-08-16 DIAGNOSIS — Z3682 Encounter for antenatal screening for nuchal translucency: Secondary | ICD-10-CM

## 2020-08-16 DIAGNOSIS — Z3A13 13 weeks gestation of pregnancy: Secondary | ICD-10-CM | POA: Diagnosis not present

## 2020-08-16 DIAGNOSIS — Z3A12 12 weeks gestation of pregnancy: Secondary | ICD-10-CM | POA: Diagnosis not present

## 2020-08-17 ENCOUNTER — Other Ambulatory Visit: Payer: Self-pay

## 2020-08-17 ENCOUNTER — Ambulatory Visit: Payer: Self-pay

## 2020-08-17 ENCOUNTER — Inpatient Hospital Stay (HOSPITAL_COMMUNITY)
Admission: AD | Admit: 2020-08-17 | Discharge: 2020-08-17 | Disposition: A | Discharge status: Home / Self Care | Payer: BC Managed Care – PPO | Attending: Obstetrics | Admitting: Obstetrics

## 2020-08-17 ENCOUNTER — Encounter (HOSPITAL_COMMUNITY): Payer: Self-pay | Admitting: Obstetrics

## 2020-08-17 DIAGNOSIS — O99411 Diseases of the circulatory system complicating pregnancy, first trimester: Secondary | ICD-10-CM | POA: Diagnosis not present

## 2020-08-17 DIAGNOSIS — Z3A12 12 weeks gestation of pregnancy: Secondary | ICD-10-CM | POA: Diagnosis not present

## 2020-08-17 DIAGNOSIS — I863 Vulval varices: Secondary | ICD-10-CM | POA: Diagnosis not present

## 2020-08-17 DIAGNOSIS — O209 Hemorrhage in early pregnancy, unspecified: Secondary | ICD-10-CM

## 2020-08-17 DIAGNOSIS — O2201 Varicose veins of lower extremity in pregnancy, first trimester: Secondary | ICD-10-CM | POA: Insufficient documentation

## 2020-08-17 MED ORDER — SILVER NITRATE-POT NITRATE 75-25 % EX MISC
CUTANEOUS | Status: AC
  Administered 2020-08-17: 1 via TOPICAL
  Filled 2020-08-17: qty 10

## 2020-08-17 MED ORDER — SILVER NITRATE-POT NITRATE 75-25 % EX MISC: 1.0000 "application " | Freq: Once | CUTANEOUS | Status: AC

## 2020-08-17 NOTE — MAU Provider Note (Signed)
Chief Complaint: Vaginal Bleeding   Event Date/Time   First Provider Initiated Contact with Patient 08/17/20 2124     Varicose vein ruptured  SUBJECTIVE HPI: Joy Pena is a 30 y.o. [id number] at [redacted]w[redacted]d who presents to maternity admissions reporting onset of bright red bleeding when wiping today. She had nuchal translucency ultrasound yesterday and everything was normal, so this was a surprise today. She did not have a transvaginal US or cervical exam yesterday.  The bleeding is more than spotting, but not heavy enough to require a pad.  She denies any recent intercourse, exercise, or other change in activities.  She is feeling normal fetal movement. There are no other symptoms. She has not tried any treatments.    HPI  Past Medical History:  Diagnosis Date  . Hemorrhoid   . PONV (postoperative nausea and vomiting)   . UTI (lower urinary tract infection)    Past Surgical History:  Procedure Laterality Date  . APPENDECTOMY    . BREAST SURGERY     Breast reduction  . CLOSED MANIPULATION KNEE WITH STERIOD INJECTION Left 10/29/2018   Procedure: CLOSED MANIPULATION KNEE WITH STEROID INJECTION;  Surgeon: Bjorn Pippin, MD;  Location: [facility];  Service: Orthopedics;  Laterality: Left;  . KNEE ARTHROSCOPY Left 08/27/2018   Procedure: ARTHROSCOPY KNEE WITH LYSIS OF ADHESIONS AND MANIPULATION;  Surgeon: Bjorn Pippin, MD;  Location: [facility];  Service: Orthopedics;  Laterality: Left;  . TONSILLECTOMY    . WISDOM TOOTH EXTRACTION     Social History   Socioeconomic History  . Marital status: Married    Spouse name: Matt  . Number of children: Not on file  . Years of education: Not on file  . Highest education level: Not on file  Occupational History  . Not on file  Tobacco Use  . Smoking status: Never Smoker  . Smokeless tobacco: Never Used  Vaping Use  . Vaping Use: Never used  Substance and Sexual Activity  . Alcohol use: No  . Drug use: No  .  Sexual activity: Yes  Other Topics Concern  . Not on file  Social History Narrative  . Not on file   Social Determinants of Health   Financial Resource Strain: Not on file  Food Insecurity: Not on file  Transportation Needs: Not on file  Physical Activity: Not on file  Stress: Not on file  Social Connections: Not on file  Intimate Partner Violence: Not on file   No current facility-administered medications on file prior to encounter.   No current outpatient medications on file prior to encounter.   No Known Allergies  ROS:  Review of Systems  Constitutional: Negative for chills and fever.  Respiratory: Negative for cough and shortness of breath.   Cardiovascular: Negative for chest pain.  Gastrointestinal: Negative for abdominal pain, nausea and vomiting.  Genitourinary: Positive for vaginal bleeding. Negative for dysuria, frequency and urgency.  Musculoskeletal: Negative.   Neurological: Negative for dizziness and headaches.     I have reviewed patient's Past Medical Hx, Surgical Hx, Family Hx, Social Hx, medications and allergies.   Physical Exam   Patient Vitals for the past 24 hrs:  BP Temp Temp src Pulse Resp SpO2  08/17/20 2209 115/77 - - 81 - -  08/17/20 2001 115/68 98.2 F (36.8 C) Oral 77 18 100 %   Constitutional: Well-developed, well-nourished female in no acute distress.  Cardiovascular: normal rate Respiratory: normal effort GI: Abd soft, non-tender. Pos  BS x 4 MS: Extremities nontender, no edema, normal ROM Neurologic: Alert and oriented x 4.  GU: Neg CVAT.  PELVIC EXAM: On visual inspection, small purple edemetous area, c/w varicose vein at perineum has visible small area of eschar and is friable to cotton swab.  SSE with no evidence of bleeding in vagina.    FHT 160 by doppler  LAB RESULTS No results found for this or any previous visit (from the past 24 hour(s)).     IMAGING   MAU Management/MDM: Orders Placed This Encounter   Procedures  . Discharge patient    Meds ordered this encounter  Medications  . silver nitrate applicators applicator 1 application  . silver nitrate applicators 75-25 % applicator    Weston, Lynnette   : cabinet override    Bleeding likely from varicosity, no evidence of vaginal bleeding on exam today.Scant amount of bleeding after speculum exam so silver nitrate placed via sterile cotton swab and bleeding resolved. Reassurance provided to pt.  Keep scheduled appt in the office.   ASSESSMENT 1. Vulvar varicose veins   2. Vaginal bleeding in pregnancy, first trimester     PLAN Discharge home Allergies as of 08/17/2020   No Known Allergies     Medication List    STOP taking these medications   levonorgestrel 20 MCG/24HR IUD Commonly known as: MIRENA       Follow-up Information    Marlow Baars, MD Follow up.   Specialty: Obstetrics Why: F/U as scheduled, return to MAU as needed. Contact information: [address] [place] [address] [phone]               Sharen Counter Certified Nurse-Midwife 08/18/2020  5:07 AM

## 2020-08-17 NOTE — MAU Note (Signed)
Patient reports to MAU with vaginal bleeding that started around 1730.  Blood is bright red and she states there is quite a bit every time she wipes.  More than spotting but not like a period.  States she did feel some cramping but had some before so she did not think it was unusual.    FHT:  160

## 2020-08-22 ENCOUNTER — Telehealth: Payer: Self-pay | Admitting: Genetic Counselor

## 2020-08-22 NOTE — Telephone Encounter (Signed)
I called Ms. Furber to discuss her negative noninvasive prenatal screening (NIPS) result. Specifically, Ms. Derrell Lolling had NIPS through [facility]. I left a voicemail discussing these results as Ms. Zaldivar had given me her permission to at the time her sample was drawn.  I informed Ms. Latini that her NIPS negative result demonstrated an expected representation of chromosome 98, 9, 93, and sex chromosome material, greatly reducing the likelihood of trisomies 43, 37, or 97 and sex chromosome aneuploidies for the pregnancy. Ms. Laursen requested to know about the expected fetal sex and confirmed that I could disclose this via voicemail. Expected fetal sex is female.  NIPS analyzes placental DNA in maternal circulation. NIPS is considered to be highly specific and sensitive, but is not considered to be a diagnostic test. I reminded Ms. Munnerlyn that this testing identifies 9-99% of pregnancies with trisomies 39, 79, and 59 as well as sex chromosome aneuploidies, but is not perfect and does not test for all genetic conditions. Diagnostic testing via chorionic villus sampling or amniocentesis is available should she be interested in confirming this result. I encouraged her to contact me if she has any questions about these results.  Gershon Crane, MS, [facility] Genetic Counselor

## 2020-08-25 ENCOUNTER — Encounter (HOSPITAL_COMMUNITY): Payer: Self-pay | Admitting: Obstetrics and Gynecology

## 2020-08-25 ENCOUNTER — Telehealth: Payer: Self-pay | Admitting: Certified Nurse Midwife

## 2020-08-25 ENCOUNTER — Other Ambulatory Visit: Payer: Self-pay

## 2020-08-25 ENCOUNTER — Inpatient Hospital Stay (HOSPITAL_COMMUNITY)
Admission: AD | Admit: 2020-08-25 | Discharge: 2020-08-25 | Disposition: A | Discharge status: Home / Self Care | Payer: BC Managed Care – PPO | Attending: Obstetrics and Gynecology | Admitting: Obstetrics and Gynecology

## 2020-08-25 DIAGNOSIS — O99891 Other specified diseases and conditions complicating pregnancy: Secondary | ICD-10-CM

## 2020-08-25 DIAGNOSIS — H8110 Benign paroxysmal vertigo, unspecified ear: Secondary | ICD-10-CM

## 2020-08-25 DIAGNOSIS — O219 Vomiting of pregnancy, unspecified: Secondary | ICD-10-CM

## 2020-08-25 DIAGNOSIS — Z3A13 13 weeks gestation of pregnancy: Secondary | ICD-10-CM | POA: Insufficient documentation

## 2020-08-25 DIAGNOSIS — O98511 Other viral diseases complicating pregnancy, first trimester: Secondary | ICD-10-CM | POA: Insufficient documentation

## 2020-08-25 DIAGNOSIS — U071 COVID-19: Secondary | ICD-10-CM | POA: Insufficient documentation

## 2020-08-25 DIAGNOSIS — O210 Mild hyperemesis gravidarum: Secondary | ICD-10-CM | POA: Diagnosis not present

## 2020-08-25 DIAGNOSIS — R42 Dizziness and giddiness: Secondary | ICD-10-CM | POA: Diagnosis not present

## 2020-08-25 DIAGNOSIS — O26891 Other specified pregnancy related conditions, first trimester: Secondary | ICD-10-CM | POA: Diagnosis not present

## 2020-08-25 LAB — COMPREHENSIVE METABOLIC PANEL
ALT: 13 U/L (ref 0–44)
AST: 16 U/L (ref 15–41)
Albumin: 3.5 g/dL (ref 3.5–5.0)
Alkaline Phosphatase: 38 U/L (ref 38–126)
Anion gap: 11 (ref 5–15)
BUN: 8 mg/dL (ref 6–20)
CO2: 19 mmol/L — ABNORMAL LOW (ref 22–32)
Calcium: 8.6 mg/dL — ABNORMAL LOW (ref 8.9–10.3)
Chloride: 105 mmol/L (ref 98–111)
Creatinine, Ser: 0.4 mg/dL — ABNORMAL LOW (ref 0.44–1.00)
GFR, Estimated: >60 mL/min (ref >60)
Glucose, Bld: 80 mg/dL (ref 70–99)
Potassium: 3.7 mmol/L (ref 3.5–5.1)
Sodium: 135 mmol/L (ref 135–145)
Total Bilirubin: 0.3 mg/dL (ref 0.3–1.2)
Total Protein: 6.9 g/dL (ref 6.5–8.1)

## 2020-08-25 LAB — URINALYSIS, ROUTINE W REFLEX MICROSCOPIC
Bilirubin Urine: NEGATIVE
Glucose, UA: NEGATIVE mg/dL
Hgb urine dipstick: NEGATIVE
Ketones, ur: NEGATIVE mg/dL
Leukocytes,Ua: NEGATIVE
Nitrite: NEGATIVE
Protein, ur: NEGATIVE mg/dL
Specific Gravity, Urine: 1.021 (ref 1.005–1.030)
pH: 7 (ref 5.0–8.0)

## 2020-08-25 LAB — CBC
HCT: 31.5 % — ABNORMAL LOW (ref 36.0–46.0)
Hemoglobin: 11.3 g/dL — ABNORMAL LOW (ref 12.0–15.0)
MCH: 30 pg (ref 26.0–34.0)
MCHC: 35.9 g/dL (ref 30.0–36.0)
MCV: 83.6 fL (ref 80.0–100.0)
Platelets: 233 10*3/uL (ref 150–400)
RBC: 3.77 MIL/uL — ABNORMAL LOW (ref 3.87–5.11)
RDW: 13.4 % (ref 11.5–15.5)
WBC: 5.4 10*3/uL (ref 4.0–10.5)
nRBC: 0 % (ref 0.0–0.2)

## 2020-08-25 LAB — SARS CORONAVIRUS 2 (TAT 6-24 HRS): SARS Coronavirus 2: POSITIVE — AB

## 2020-08-25 MED ORDER — LACTATED RINGERS IV BOLUS
1000.0000 mL | Freq: Once | INTRAVENOUS | Status: AC
  Administered 2020-08-25: 1000 mL via INTRAVENOUS

## 2020-08-25 MED ORDER — ONDANSETRON 8 MG PO TBDP: 8.0000 mg | ORAL_TABLET | Freq: Three times a day (TID) | ORAL | 0 refills | Status: DC | PRN

## 2020-08-25 MED ORDER — MECLIZINE HCL 12.5 MG PO TABS: 12.5000 mg | ORAL_TABLET | Freq: Three times a day (TID) | ORAL | 0 refills | Status: DC | PRN

## 2020-08-25 MED ORDER — SODIUM CHLORIDE 0.9 % IV SOLN
8.0000 mg | Freq: Once | INTRAVENOUS | Status: DC
  Filled 2020-08-25: qty 4

## 2020-08-25 MED ORDER — MECLIZINE HCL 25 MG PO TABS
25.0000 mg | ORAL_TABLET | Freq: Once | ORAL | Status: DC
  Filled 2020-08-25: qty 1

## 2020-08-25 MED ORDER — PROMETHAZINE HCL 25 MG/ML IJ SOLN
25.0000 mg | Freq: Once | INTRAMUSCULAR | Status: AC
  Administered 2020-08-25: 25 mg via INTRAVENOUS
  Filled 2020-08-25: qty 1

## 2020-08-25 MED ORDER — ONDANSETRON HCL 4 MG/2ML IJ SOLN
INTRAMUSCULAR | Status: AC
  Administered 2020-08-25: 8 mg
  Filled 2020-08-25: qty 4

## 2020-08-25 NOTE — MAU Note (Addendum)
Presents with c/o N/V, unable to keep anything down x1 week.  Also had a fever Monday, but nothing since.  Reports also having dizziness, but hasn't passed out.  Took an out home Covid test on Monday (08/21/2020), results negative.  Denies VB or LOF.

## 2020-08-25 NOTE — MAU Provider Note (Signed)
Chief Complaint:  Emesis and Nausea   Event Date/Time   First Provider Initiated Contact with Patient 08/25/20 1501     HPI: Joy Pena is a 30 y.o. [id number] at [redacted]w[redacted]d who presents to maternity admissions reporting severe nausea, vomiting and dizziness. She has battled n/v for her entire first trimester, but it has worsened this week. She also had a fever on Monday and has had some nasal congestion but tested negative (via home test) for Covid-19 (on Monday).  Since then, she has had trouble keeping food or fluids down, fluids stay down a little longer but she will eventually vomit. She describes the dizziness as "the room spinning". Denies vaginal bleeding or discharge.  Pregnancy Course: Receives OB care from [facility]  Past Medical History:  Diagnosis Date   Hemorrhoid    PONV (postoperative nausea and vomiting)    UTI (lower urinary tract infection)    OB History  Gravida Para Term Preterm AB Living  5 2 2   2 2   SAB IAB Ectopic Multiple Live Births  2     0 2    # Outcome Date GA Lbr Len/2nd Weight Sex Delivery Anes PTL Lv  5 Current           4 Term 10/10/17 [redacted]w[redacted]d 02:27 / 00:08 7 lb 2.1 oz (3.235 kg) F Vag-Spont EPI  LIV  3 Term 08/25/15 [redacted]w[redacted]d 19:01 / 01:00 7 lb 8.5 oz (3.415 kg) F Vag-Spont EPI  LIV     Birth Comments: none  2 SAB           1 SAB            Past Surgical History:  Procedure Laterality Date   APPENDECTOMY     BREAST SURGERY     Breast reduction   CLOSED MANIPULATION KNEE WITH STERIOD INJECTION Left 10/29/2018   Procedure: CLOSED MANIPULATION KNEE WITH STEROID INJECTION;  Surgeon: 10/31/2018, MD;  Location: [facility];  Service: Orthopedics;  Laterality: Left;   KNEE ARTHROSCOPY Left 08/27/2018   Procedure: ARTHROSCOPY KNEE WITH LYSIS OF ADHESIONS AND MANIPULATION;  Surgeon: 08/29/2018, MD;  Location: [facility];  Service: Orthopedics;  Laterality: Left;   TONSILLECTOMY     WISDOM TOOTH EXTRACTION      Family History  Problem Relation Age of Onset   Cancer Maternal Grandfather    Social History   Tobacco Use   Smoking status: Never Smoker   Smokeless tobacco: Never Used  Vaping Use   Vaping Use: Never used  Substance Use Topics   Alcohol use: No   Drug use: No   No Known Allergies No medications prior to admission.   I have reviewed patient's Past Medical Hx, Surgical Hx, Family Hx, Social Hx, medications and allergies.   ROS:  Review of Systems  Constitutional: Positive for appetite change (decreased) and fever (on Monday only).  HENT: Positive for congestion.   Eyes: Negative for visual disturbance.  Respiratory: Negative for cough and shortness of breath.   Gastrointestinal: Positive for nausea and vomiting. Negative for abdominal pain.  Genitourinary: Negative for decreased urine volume, dysuria, vaginal bleeding and vaginal discharge.  Neurological: Positive for dizziness (vertigo). Negative for syncope, light-headedness and headaches.    Physical Exam   Patient Vitals for the past 24 hrs:  BP Temp Temp src Pulse Resp SpO2 Height Weight  08/25/20 1903 (!) 102/51 -- -- 81 -- -- -- --  08/25/20 1510 111/69 -- --  81 -- 100 % -- --  08/25/20 1506 104/66 -- -- (!) 101 -- -- -- --  08/25/20 1505 101/67 -- -- 86 -- 99 % -- --  08/25/20 1504 (!) 101/58 -- -- 79 -- -- -- --  08/25/20 1304 113/64 98.1 F (36.7 C) Oral 93 20 100 % -- --  08/25/20 1258 -- -- -- -- -- -- 5\' 2"  (1.575 m) 139 lb (63 kg)   Constitutional: Well-developed, well-nourished female in no acute distress (talkative, no vomiting).  Cardiovascular: normal rate & rhythm, no murmur Respiratory: normal effort, lung sounds clear throughout GI: Abd soft, non-tender, gravid appropriate for gestational age. Pos BS x 4 MS: Extremities nontender, no edema, normal ROM Neurologic: Alert and oriented x 4.  GU: no CVA tenderness Pelvic exam deferred  Bedside ultrasound performed for FHR: 162    Labs: Results for orders placed or performed during the hospital encounter of 08/25/20 (from the past 24 hour(s))  Urinalysis, Routine w reflex microscopic Urine, Clean Catch     Status: Abnormal   Collection Time: 08/25/20  1:19 PM  Result Value Ref Range   Color, Urine YELLOW YELLOW   APPearance CLOUDY (A) CLEAR   Specific Gravity, Urine 1.021 1.005 - 1.030   pH 7.0 5.0 - 8.0   Glucose, UA NEGATIVE NEGATIVE mg/dL   Hgb urine dipstick NEGATIVE NEGATIVE   Bilirubin Urine NEGATIVE NEGATIVE   Ketones, ur NEGATIVE NEGATIVE mg/dL   Protein, ur NEGATIVE NEGATIVE mg/dL   Nitrite NEGATIVE NEGATIVE   Leukocytes,Ua NEGATIVE NEGATIVE  CBC     Status: Abnormal   Collection Time: 08/25/20  3:00 PM  Result Value Ref Range   WBC 5.4 4.0 - 10.5 K/uL   RBC 3.77 (L) 3.87 - 5.11 MIL/uL   Hemoglobin 11.3 (L) 12.0 - 15.0 g/dL   HCT 08/27/20 (L) 71.6 - 96.7 %   MCV 83.6 80.0 - 100.0 fL   MCH 30.0 26.0 - 34.0 pg   MCHC 35.9 30.0 - 36.0 g/dL   RDW 89.3 81.0 - 17.5 %   Platelets 233 150 - 400 K/uL   nRBC 0.0 0.0 - 0.2 %  Comprehensive metabolic panel     Status: Abnormal   Collection Time: 08/25/20  3:00 PM  Result Value Ref Range   Sodium 135 135 - 145 mmol/L   Potassium 3.7 3.5 - 5.1 mmol/L   Chloride 105 98 - 111 mmol/L   CO2 19 (L) 22 - 32 mmol/L   Glucose, Bld 80 70 - 99 mg/dL   BUN 8 6 - 20 mg/dL   Creatinine, Ser 08/27/20 (L) 0.44 - 1.00 mg/dL   Calcium 8.6 (L) 8.9 - 10.3 mg/dL   Total Protein 6.9 6.5 - 8.1 g/dL   Albumin 3.5 3.5 - 5.0 g/dL   AST 16 15 - 41 U/L   ALT 13 0 - 44 U/L   Alkaline Phosphatase 38 38 - 126 U/L   Total Bilirubin 0.3 0.3 - 1.2 mg/dL   GFR, Estimated 5.85 >27 mL/min   Anion gap 11 5 - 15    Imaging:  No results found.  MAU Course: Orders Placed This Encounter  Procedures   SARS CORONAVIRUS 2 (TAT 6-24 HRS) Nasopharyngeal Nasopharyngeal Swab   Urinalysis, Routine w reflex microscopic Urine, Clean Catch   CBC   Comprehensive metabolic panel   Orthostatic  vital signs   Airborne and Contact precautions   EKG 12-Lead   Discharge patient   Meds ordered this encounter  Medications  lactated ringers bolus 1,000 mL   promethazine (PHENERGAN) injection 25 mg   meclizine (ANTIVERT) tablet 25 mg   ondansetron (ZOFRAN) 8 mg in sodium chloride 0.9 % 50 mL IVPB   ondansetron (ZOFRAN) 4 MG/2ML injection    Aube, Melissa   : cabinet override   ondansetron (ZOFRAN ODT) 8 MG disintegrating tablet    Sig: Take 1 tablet (8 mg total) by mouth every 8 (eight) hours as needed for nausea or vomiting.    Dispense:  20 tablet    Refill:  0    Order Specific Question:   Supervising Provider    Answer:   Reva Bores [[id number]]   meclizine (ANTIVERT) 12.5 MG tablet    Sig: Take 1-2 tablets (12.5-25 mg total) by mouth 3 (three) times daily as needed for dizziness.    Dispense:  30 tablet    Refill:  0    Order Specific Question:   Supervising Provider    Answer:   Reva Bores [[id number]]    MDM: LR bolus + phenergan given with good relief of nausea but increased vertigo  UA, CBC, CMP normal EKG normal (independently reviewed by Dr. Adrian Blackwater) Orthostatic BP normal  PO test with crackers and sips of water  Discussed vertigo with Dr. Adrian Blackwater who suggested antivert. When RN went to give it, pt was vomiting and requested additional meds for the nausea. Zofran IV given, pt declined antivert here but agreed to try at home.  Covid swab pending  Assessment: 1. Nausea and vomiting during pregnancy   2. Vertigo    Plan: Discharge home in stable condition with return precautions   Follow-up Information    Ob/Gyn, Nestor Ramp. Go to.   Why: as scheduled for ongoing prenatal care Contact information: [address] [place] [address] [phone]               Allergies as of 08/25/2020   No Known Allergies     Medication List    TAKE these medications   meclizine 12.5 MG tablet Commonly known as: ANTIVERT Take 1-2  tablets (12.5-25 mg total) by mouth 3 (three) times daily as needed for dizziness.   multivitamin-prenatal 27-0.8 MG Tabs tablet Take 1 tablet by mouth daily at 12 noon.   ondansetron 8 MG disintegrating tablet Commonly known as: Zofran ODT Take 1 tablet (8 mg total) by mouth every 8 (eight) hours as needed for nausea or vomiting.   promethazine 12.5 MG suppository Commonly known as: PHENERGAN Place 12.5 mg rectally every 6 (six) hours as needed for nausea or vomiting.       Edd Arbour, CNM, MSN, IBCLC Certified Nurse Midwife, [facility]

## 2020-08-25 NOTE — Progress Notes (Signed)
Orthostatic vitals:  Laying: 101/58 Sitting: 101/67 Standing: 104/66  : 111/69  Adan Sis Vonetta Foulk RNC-OB

## 2020-08-25 NOTE — Discharge Instructions (Signed)
How to Perform the Epley Maneuver The Epley maneuver is an exercise that relieves symptoms of vertigo. Vertigo is the feeling that you or your surroundings are moving when they are not. When you feel vertigo, you may feel like the room is spinning and may have trouble walking. The Epley maneuver is used for a type of vertigo caused by a calcium deposit in a part of the inner ear. The maneuver involves changing head positions to help the deposit move out of the area. You can do this maneuver at home whenever you have symptoms of vertigo. You can repeat it in 24 hours if your vertigo has not gone away. Even though the Epley maneuver may relieve your vertigo for a few weeks, it is possible that your symptoms will return. This maneuver relieves vertigo, but it does not relieve dizziness. What are the risks? If it is done correctly, the Epley maneuver is considered safe. Sometimes it can lead to dizziness or nausea that goes away after a short time. If you develop other symptoms--such as changes in vision, weakness, or numbness--stop doing the maneuver and call your health care provider. Supplies needed:  A bed or table.  A pillow. How to do the Epley maneuver 1. Sit on the edge of a bed or table with your back straight and your legs extended or hanging over the edge of the bed or table. 2. Turn your head halfway toward the affected ear or side as told by your health care provider. 3. Lie backward quickly with your head turned until you are lying flat on your back. You may want to position a pillow under your shoulders. 4. Hold this position for at least 30 seconds. If you feel dizzy or have symptoms of vertigo, continue to hold the position until the symptoms stop. 5. Turn your head to the opposite direction until your unaffected ear is facing the floor. 6. Hold this position for at least 30 seconds. If you feel dizzy or have symptoms of vertigo, continue to hold the position until the symptoms  stop. 7. Turn your whole body to the same side as your head so that you are positioned on your side. Your head will now be nearly facedown. Hold for at least 30 seconds. If you feel dizzy or have symptoms of vertigo, continue to hold the position until the symptoms stop. 8. Sit back up. You can repeat the maneuver in 24 hours if your vertigo does not go away.      Follow these instructions at home: For 24 hours after doing the Epley maneuver:  Keep your head in an upright position.  When lying down to sleep or rest, keep your head raised (elevated) with two or more pillows.  Avoid excessive neck movements. Activity  Do not drive or use machinery if you feel dizzy.  After doing the Epley maneuver, return to your normal activities as told by your health care provider. Ask your health care provider what activities are safe for you. General instructions  Drink enough fluid to keep your urine pale yellow.  Do not drink alcohol.  Take over-the-counter and prescription medicines only as told by your health care provider.  Keep all follow-up visits as told by your health care provider. This is important. Preventing vertigo symptoms Ask your health care provider if there is anything you should do at home to prevent vertigo. He or she may recommend that you:  Keep your head elevated with two or more pillows while you sleep.    Do not sleep on the side of your affected ear.  Get up slowly from bed.  Avoid sudden movements during the day.  Avoid extreme head positions or movement, such as looking up or bending over. Contact a health care provider if:  Your vertigo gets worse.  You have other symptoms, including: ? Nausea. ? Vomiting. ? Headache. Get help right away if you:  Have vision changes.  Have a headache or neck pain that is severe or getting worse.  Cannot stop vomiting.  Have new numbness or weakness in any part of your body. Summary  Vertigo is the feeling that  you or your surroundings are moving when they are not.  The Epley maneuver is an exercise that relieves symptoms of vertigo.  If the Epley maneuver is done correctly, it is considered safe and relieves vertigo quickly. This information is not intended to replace advice given to you by your health care provider. Make sure you discuss any questions you have with your health care provider. Document Revised: 05/19/2019 Document Reviewed: 05/19/2019 Elsevier Patient Education  2021 Elsevier Inc.  

## 2020-08-26 NOTE — Telephone Encounter (Signed)
Accidental encounter - pt informed via MyChart

## 2020-09-10 ENCOUNTER — Other Ambulatory Visit: Payer: Self-pay | Admitting: *Deleted

## 2020-09-27 ENCOUNTER — Other Ambulatory Visit: Payer: Self-pay

## 2020-09-27 ENCOUNTER — Ambulatory Visit: Payer: BC Managed Care – PPO | Attending: Obstetrics and Gynecology

## 2020-09-27 ENCOUNTER — Ambulatory Visit: Payer: BC Managed Care – PPO | Admitting: *Deleted

## 2020-09-27 VITALS — BP 113/56 | HR 87

## 2020-09-27 DIAGNOSIS — U071 COVID-19: Secondary | ICD-10-CM | POA: Diagnosis present

## 2020-09-27 DIAGNOSIS — Z3A18 18 weeks gestation of pregnancy: Secondary | ICD-10-CM

## 2020-09-27 DIAGNOSIS — Z3689 Encounter for other specified antenatal screening: Secondary | ICD-10-CM | POA: Diagnosis present

## 2020-09-27 DIAGNOSIS — Z363 Encounter for antenatal screening for malformations: Secondary | ICD-10-CM

## 2020-09-27 DIAGNOSIS — O358XX0 Maternal care for other (suspected) fetal abnormality and damage, not applicable or unspecified: Secondary | ICD-10-CM | POA: Diagnosis not present

## 2021-01-30 LAB — OB RESULTS CONSOLE GBS: GBS: NEGATIVE

## 2021-02-15 ENCOUNTER — Encounter (HOSPITAL_COMMUNITY): Payer: Self-pay | Admitting: *Deleted

## 2021-02-15 ENCOUNTER — Telehealth (HOSPITAL_COMMUNITY): Payer: Self-pay | Admitting: *Deleted

## 2021-02-15 NOTE — Telephone Encounter (Signed)
Preadmission screen  

## 2021-02-16 ENCOUNTER — Other Ambulatory Visit (HOSPITAL_COMMUNITY)
Admission: RE | Admit: 2021-02-16 | Discharge: 2021-02-16 | Disposition: A | Discharge status: Home / Self Care | Payer: No Typology Code available for payment source | Source: Ambulatory Visit | Attending: Obstetrics and Gynecology | Admitting: Obstetrics and Gynecology

## 2021-02-16 DIAGNOSIS — Z01812 Encounter for preprocedural laboratory examination: Secondary | ICD-10-CM | POA: Insufficient documentation

## 2021-02-16 DIAGNOSIS — Z20822 Contact with and (suspected) exposure to covid-19: Secondary | ICD-10-CM | POA: Insufficient documentation

## 2021-02-16 LAB — SARS CORONAVIRUS 2 (TAT 6-24 HRS): SARS Coronavirus 2: NEGATIVE

## 2021-02-19 ENCOUNTER — Inpatient Hospital Stay (HOSPITAL_COMMUNITY): Payer: No Typology Code available for payment source | Admitting: Anesthesiology

## 2021-02-19 ENCOUNTER — Inpatient Hospital Stay (HOSPITAL_COMMUNITY)
Admission: AD | Admit: 2021-02-19 | Discharge: 2021-02-20 | DRG: 807 | Disposition: A | Discharge status: Home / Self Care | Payer: No Typology Code available for payment source | Attending: Obstetrics and Gynecology | Admitting: Obstetrics and Gynecology

## 2021-02-19 ENCOUNTER — Other Ambulatory Visit: Payer: Self-pay

## 2021-02-19 ENCOUNTER — Inpatient Hospital Stay (HOSPITAL_COMMUNITY): Payer: No Typology Code available for payment source

## 2021-02-19 ENCOUNTER — Encounter (HOSPITAL_COMMUNITY): Payer: Self-pay | Admitting: Obstetrics and Gynecology

## 2021-02-19 DIAGNOSIS — Z20822 Contact with and (suspected) exposure to covid-19: Secondary | ICD-10-CM | POA: Diagnosis present

## 2021-02-19 DIAGNOSIS — O26893 Other specified pregnancy related conditions, third trimester: Secondary | ICD-10-CM | POA: Diagnosis present

## 2021-02-19 DIAGNOSIS — O9902 Anemia complicating childbirth: Secondary | ICD-10-CM | POA: Diagnosis present

## 2021-02-19 DIAGNOSIS — Z3A39 39 weeks gestation of pregnancy: Secondary | ICD-10-CM | POA: Diagnosis not present

## 2021-02-19 LAB — CBC
HCT: 27.2 % — ABNORMAL LOW (ref 36.0–46.0)
Hemoglobin: 9.2 g/dL — ABNORMAL LOW (ref 12.0–15.0)
MCH: 27.7 pg (ref 26.0–34.0)
MCHC: 33.8 g/dL (ref 30.0–36.0)
MCV: 81.9 fL (ref 80.0–100.0)
Platelets: 227 10*3/uL (ref 150–400)
RBC: 3.32 MIL/uL — ABNORMAL LOW (ref 3.87–5.11)
RDW: 14.6 % (ref 11.5–15.5)
WBC: 7.2 10*3/uL (ref 4.0–10.5)
nRBC: 0 % (ref 0.0–0.2)

## 2021-02-19 LAB — TYPE AND SCREEN
ABO/RH(D): O POS
Antibody Screen: NEGATIVE

## 2021-02-19 LAB — RPR: RPR Ser Ql: NONREACTIVE

## 2021-02-19 MED ORDER — OXYTOCIN-SODIUM CHLORIDE 30-0.9 UT/500ML-% IV SOLN
1.0000 m[IU]/min | INTRAVENOUS | Status: DC
  Administered 2021-02-19: 2 m[IU]/min via INTRAVENOUS
  Filled 2021-02-19: qty 500

## 2021-02-19 MED ORDER — TERBUTALINE SULFATE 1 MG/ML IJ SOLN: 0.2500 mg | Freq: Once | INTRAMUSCULAR | Status: DC | PRN

## 2021-02-19 MED ORDER — PRENATAL MULTIVITAMIN CH
1.0000 | ORAL_TABLET | Freq: Every day | ORAL | Status: DC
  Administered 2021-02-20: 1 via ORAL
  Filled 2021-02-19: qty 1

## 2021-02-19 MED ORDER — FERROUS SULFATE 325 (65 FE) MG PO TABS
325.0000 mg | ORAL_TABLET | Freq: Every day | ORAL | Status: DC
  Administered 2021-02-20: 325 mg via ORAL
  Filled 2021-02-19: qty 1

## 2021-02-19 MED ORDER — OXYTOCIN-SODIUM CHLORIDE 30-0.9 UT/500ML-% IV SOLN: 2.5000 [IU]/h | INTRAVENOUS | Status: DC

## 2021-02-19 MED ORDER — OXYCODONE HCL 5 MG PO TABS: 5.0000 mg | ORAL_TABLET | ORAL | Status: DC | PRN

## 2021-02-19 MED ORDER — LACTATED RINGERS IV SOLN: 500.0000 mL | Freq: Once | INTRAVENOUS | Status: DC

## 2021-02-19 MED ORDER — COCONUT OIL OIL: 1.0000 "application " | TOPICAL_OIL | Status: DC | PRN

## 2021-02-19 MED ORDER — ONDANSETRON HCL 4 MG/2ML IJ SOLN: 4.0000 mg | INTRAMUSCULAR | Status: DC | PRN

## 2021-02-19 MED ORDER — BISACODYL 10 MG RE SUPP: 10.0000 mg | Freq: Every day | RECTAL | Status: DC | PRN

## 2021-02-19 MED ORDER — FLEET ENEMA 7-19 GM/118ML RE ENEM: 1.0000 | ENEMA | Freq: Every day | RECTAL | Status: DC | PRN

## 2021-02-19 MED ORDER — OXYCODONE-ACETAMINOPHEN 5-325 MG PO TABS: 2.0000 | ORAL_TABLET | ORAL | Status: DC | PRN

## 2021-02-19 MED ORDER — SENNOSIDES-DOCUSATE SODIUM 8.6-50 MG PO TABS
2.0000 | ORAL_TABLET | Freq: Every day | ORAL | Status: DC
  Administered 2021-02-20: 2 via ORAL
  Filled 2021-02-19: qty 2

## 2021-02-19 MED ORDER — LACTATED RINGERS IV SOLN: INTRAVENOUS | Status: DC

## 2021-02-19 MED ORDER — OXYTOCIN BOLUS FROM INFUSION
333.0000 mL | Freq: Once | INTRAVENOUS | Status: AC
  Administered 2021-02-19: 333 mL via INTRAVENOUS

## 2021-02-19 MED ORDER — FENTANYL CITRATE (PF) 100 MCG/2ML IJ SOLN: 50.0000 ug | INTRAMUSCULAR | Status: DC | PRN

## 2021-02-19 MED ORDER — WITCH HAZEL-GLYCERIN EX PADS
1.0000 "application " | MEDICATED_PAD | CUTANEOUS | Status: DC | PRN
  Administered 2021-02-19: 1 via TOPICAL

## 2021-02-19 MED ORDER — PHENYLEPHRINE 40 MCG/ML (10ML) SYRINGE FOR IV PUSH (FOR BLOOD PRESSURE SUPPORT): 80.0000 ug | PREFILLED_SYRINGE | INTRAVENOUS | Status: DC | PRN

## 2021-02-19 MED ORDER — DIPHENHYDRAMINE HCL 50 MG/ML IJ SOLN: 12.5000 mg | INTRAMUSCULAR | Status: DC | PRN

## 2021-02-19 MED ORDER — IBUPROFEN 600 MG PO TABS
600.0000 mg | ORAL_TABLET | Freq: Four times a day (QID) | ORAL | Status: DC
  Administered 2021-02-19 – 2021-02-20 (×4): 600 mg via ORAL
  Filled 2021-02-19 (×4): qty 1

## 2021-02-19 MED ORDER — ACETAMINOPHEN 325 MG PO TABS
650.0000 mg | ORAL_TABLET | ORAL | Status: DC | PRN
  Administered 2021-02-19: 650 mg via ORAL
  Filled 2021-02-19: qty 2

## 2021-02-19 MED ORDER — LIDOCAINE HCL (PF) 1 % IJ SOLN: 30.0000 mL | INTRAMUSCULAR | Status: DC | PRN

## 2021-02-19 MED ORDER — EPHEDRINE 5 MG/ML INJ: 10.0000 mg | INTRAVENOUS | Status: DC | PRN

## 2021-02-19 MED ORDER — ONDANSETRON HCL 4 MG PO TABS: 4.0000 mg | ORAL_TABLET | ORAL | Status: DC | PRN

## 2021-02-19 MED ORDER — OXYCODONE HCL 5 MG PO TABS: 10.0000 mg | ORAL_TABLET | ORAL | Status: DC | PRN

## 2021-02-19 MED ORDER — SIMETHICONE 80 MG PO CHEW: 80.0000 mg | CHEWABLE_TABLET | ORAL | Status: DC | PRN

## 2021-02-19 MED ORDER — ONDANSETRON HCL 4 MG/2ML IJ SOLN
4.0000 mg | Freq: Four times a day (QID) | INTRAMUSCULAR | Status: DC | PRN
  Administered 2021-02-19: 4 mg via INTRAVENOUS
  Filled 2021-02-19: qty 2

## 2021-02-19 MED ORDER — LACTATED RINGERS IV SOLN: 500.0000 mL | INTRAVENOUS | Status: DC | PRN

## 2021-02-19 MED ORDER — TETANUS-DIPHTH-ACELL PERTUSSIS 5-2.5-18.5 LF-MCG/0.5 IM SUSY: 0.5000 mL | PREFILLED_SYRINGE | Freq: Once | INTRAMUSCULAR | Status: DC

## 2021-02-19 MED ORDER — SOD CITRATE-CITRIC ACID 500-334 MG/5ML PO SOLN: 30.0000 mL | ORAL | Status: DC | PRN

## 2021-02-19 MED ORDER — FENTANYL-BUPIVACAINE-NACL 0.5-0.125-0.9 MG/250ML-% EP SOLN
12.0000 mL/h | EPIDURAL | Status: DC | PRN
  Administered 2021-02-19: 12 mL/h via EPIDURAL
  Filled 2021-02-19: qty 250

## 2021-02-19 MED ORDER — ZOLPIDEM TARTRATE 5 MG PO TABS: 5.0000 mg | ORAL_TABLET | Freq: Every evening | ORAL | Status: DC | PRN

## 2021-02-19 MED ORDER — LIDOCAINE HCL (PF) 1 % IJ SOLN
INTRAMUSCULAR | Status: DC | PRN
  Administered 2021-02-19 (×2): 4 mL via EPIDURAL

## 2021-02-19 MED ORDER — DIBUCAINE (PERIANAL) 1 % EX OINT
1.0000 "application " | TOPICAL_OINTMENT | CUTANEOUS | Status: DC | PRN
  Administered 2021-02-19: 1 via RECTAL
  Filled 2021-02-19: qty 28

## 2021-02-19 MED ORDER — BENZOCAINE-MENTHOL 20-0.5 % EX AERO
1.0000 "application " | INHALATION_SPRAY | CUTANEOUS | Status: DC | PRN
  Administered 2021-02-19: 1 via TOPICAL
  Filled 2021-02-19: qty 56

## 2021-02-19 MED ORDER — OXYCODONE-ACETAMINOPHEN 5-325 MG PO TABS: 1.0000 | ORAL_TABLET | ORAL | Status: DC | PRN

## 2021-02-19 MED ORDER — DIPHENHYDRAMINE HCL 25 MG PO CAPS: 25.0000 mg | ORAL_CAPSULE | Freq: Four times a day (QID) | ORAL | Status: DC | PRN

## 2021-02-19 MED ORDER — ACETAMINOPHEN 325 MG PO TABS: 650.0000 mg | ORAL_TABLET | ORAL | Status: DC | PRN

## 2021-02-19 NOTE — Anesthesia Procedure Notes (Signed)
Epidural Patient location during procedure: OB Start time: 02/19/2021 10:44 AM End time: 02/19/2021 10:48 AM  Staffing Anesthesiologist: Kaylyn Layer, MD Performed: anesthesiologist   Preanesthetic Checklist Completed: patient identified, IV checked, risks and benefits discussed, monitors and equipment checked, pre-op evaluation and timeout performed  Epidural Patient position: sitting Prep: DuraPrep and site prepped and draped Patient monitoring: continuous pulse ox, blood pressure and heart rate Approach: midline Location: L3-L4 Injection technique: LOR air  Needle:  Needle type: Tuohy  Needle gauge: 17 G Needle length: 9 cm Needle insertion depth: 5 cm Catheter type: closed end flexible Catheter size: 19 Gauge Catheter at skin depth: 10 cm Test dose: negative and Other (1% lidocaine)  Assessment Events: blood not aspirated, injection not painful, no injection resistance, no paresthesia and negative IV test  Additional Notes Patient identified. Risks, benefits, and alternatives discussed with patient including but not limited to bleeding, infection, nerve damage, paralysis, failed block, incomplete pain control, headache, blood pressure changes, nausea, vomiting, reactions to medication, itching, and postpartum back pain. Confirmed with bedside nurse the patient's most recent platelet count. Confirmed with patient that they are not currently taking any anticoagulation, have any bleeding history, or any family history of bleeding disorders. Patient expressed understanding and wished to proceed. All questions were answered. Sterile technique was used throughout the entire procedure. Please see nursing notes for vital signs.   Crisp LOR on first pass. Test dose was given through epidural catheter and negative prior to continuing to dose epidural or start infusion. Warning signs of high block given to the patient including shortness of breath, tingling/numbness in hands, complete  motor block, or any concerning symptoms with instructions to call for help. Patient was given instructions on fall risk and not to get out of bed. All questions and concerns addressed with instructions to call with any issues or inadequate analgesia.  Reason for block:procedure for pain

## 2021-02-19 NOTE — Anesthesia Preprocedure Evaluation (Signed)
Anesthesia Evaluation  Patient identified by MRN, date of birth, ID band Patient awake    Reviewed: Allergy & Precautions, Patient's Chart, lab work & pertinent test results  History of Anesthesia Complications (+) PONV and history of anesthetic complications  Airway Mallampati: II  TM Distance: >3 FB Neck ROM: Full    Dental no notable dental hx.    Pulmonary neg pulmonary ROS,    Pulmonary exam normal        Cardiovascular negative cardio ROS Normal cardiovascular exam     Neuro/Psych negative neurological ROS  negative psych ROS   GI/Hepatic negative GI ROS, Neg liver ROS,   Endo/Other  negative endocrine ROS  Renal/GU negative Renal ROS  negative genitourinary   Musculoskeletal negative musculoskeletal ROS (+)   Abdominal   Peds  Hematology  (+) anemia , Hgb 9.2   Anesthesia Other Findings Day of surgery medications reviewed with patient.  Reproductive/Obstetrics (+) Pregnancy                             Anesthesia Physical Anesthesia Plan  ASA: 2  Anesthesia Plan: Epidural   Post-op Pain Management:    Induction:   PONV Risk Score and Plan: Treatment may vary due to age or medical condition  Airway Management Planned: Natural Airway  Additional Equipment:   Intra-op Plan:   Post-operative Plan:   Informed Consent: I have reviewed the patients History and Physical, chart, labs and discussed the procedure including the risks, benefits and alternatives for the proposed anesthesia with the patient or authorized representative who has indicated his/her understanding and acceptance.       Plan Discussed with:   Anesthesia Plan Comments:         Anesthesia Quick Evaluation

## 2021-02-19 NOTE — H&P (Signed)
  Joy Pena is a 30 y.o. female [phone] [redacted]w[redacted]d presenting for elective induction of labor. She reports no LOF or VB. Reports Braxton Hicks contractions. Normal FM.   Pregnancy c/b: Anemia of pregnancy: admission hgb 9.2, has been taking PO iron  OB History     Gravida  5   Para  2   Term  2   Preterm      AB  2   Living  2      SAB  2   IAB      Ectopic      Multiple  0   Live Births  2          Past Medical History:  Diagnosis Date   Hemorrhoid    PONV (postoperative nausea and vomiting)    UTI (lower urinary tract infection)    Past Surgical History:  Procedure Laterality Date   APPENDECTOMY     BREAST SURGERY     Breast reduction   CLOSED MANIPULATION KNEE WITH STERIOD INJECTION Left 10/29/2018   Procedure: CLOSED MANIPULATION KNEE WITH STEROID INJECTION;  Surgeon: Bjorn Pippin, MD;  Location: [facility];  Service: Orthopedics;  Laterality: Left;   KNEE ARTHROSCOPY Left 08/27/2018   Procedure: ARTHROSCOPY KNEE WITH LYSIS OF ADHESIONS AND MANIPULATION;  Surgeon: Bjorn Pippin, MD;  Location:  SURGERY CENTER;  Service: Orthopedics;  Laterality: Left;   TONSILLECTOMY     WISDOM TOOTH EXTRACTION     Family History: family history includes Cancer in her maternal grandfather. Social History:  reports that she has never smoked. She has never used smokeless tobacco. She reports that she does not drink alcohol and does not use drugs.     Maternal Diabetes: No Genetic Screening: Normal Maternal Ultrasounds/Referrals: Normal Fetal Ultrasounds or other Referrals:  None Maternal Substance Abuse:  No Significant Maternal Medications:  None Significant Maternal Lab Results:  Group B Strep negative Other Comments:  None  Review of Systems Per HPI Exam Physical Exam  Dilation: 2.5 Effacement (%): Thick Station: -2 Exam by:: J.Cox, RN Blood pressure 118/74, pulse 77, temperature 98.1 F (36.7 C), temperature source Oral, resp.  rate 18, height 5\' 2"  (1.575 m), weight 74.8 kg, last menstrual period 05/13/2020. Gen: well appearing, pleasant Lungs: nonlabored breathing Abd: soft, gravid, Leopolds 7# Pelvic: cervix 2.5/thick/-2  per RN Ext: no calf edema or tenderness  Fetal testing:  125bpm, moderate variability, + accels, no decels Toco: ctx q 4-6 mins Prenatal labs: ABO, Rh:  --/--/O POS (07/18 0751) Antibody: NEG (07/18 0751) Rubella: Immune (12/15 0000) RPR: Nonreactive (12/15 0000)  HBsAg: Negative (12/15 0000)  HIV: Non-reactive (12/15 0000)  GBS: Negative/-- (06/28 0000)   Assessment/Plan: 01-28-1975 @ [redacted]w[redacted]d, elective IOL Fetal wellbeing: category I tracing Induction: pitocin currently at 29mu/min. Discussed AROM. Has h/o of fast labor after ROM so will plan to get epidural before AROM.  Anemia: continue PO iron, monitor blood loss Pain control: planning epidural   11m 02/19/2021, 9:29 AM

## 2021-02-19 NOTE — Progress Notes (Signed)
OB Progress Note  S: Pt comfortable with epidura;   O: Today's Vitals   02/19/21 1131 02/19/21 1135 02/19/21 1140 02/19/21 1145  BP: (!) 103/55     Pulse: 69     Resp:      Temp:      TempSrc:      SpO2:  99% 99% 99%  Weight:      Height:       Body mass index is 30.14 kg/m.   SVE 4/50/-2, AROM clear fluid  FHR: 120bpm, mod variability, + accels,no decels Toco: ctx q 3-4    A/P:30Y [id number] @ 102w2d, elective IOL Fetal wellbeing: category I tracing Induction: continue pitocin, currently at 34mu/min, s/p AROM.  Anemia: continue PO iron, monitor blood loss Pain control: epidural       M. Timothy Lasso, MD 02/19/21 11:56 AM

## 2021-02-20 LAB — CBC
HCT: 25.6 % — ABNORMAL LOW (ref 36.0–46.0)
Hemoglobin: 8.5 g/dL — ABNORMAL LOW (ref 12.0–15.0)
MCH: 27.2 pg (ref 26.0–34.0)
MCHC: 33.2 g/dL (ref 30.0–36.0)
MCV: 81.8 fL (ref 80.0–100.0)
Platelets: 215 10*3/uL (ref 150–400)
RBC: 3.13 MIL/uL — ABNORMAL LOW (ref 3.87–5.11)
RDW: 14.6 % (ref 11.5–15.5)
WBC: 11 10*3/uL — ABNORMAL HIGH (ref 4.0–10.5)
nRBC: 0 % (ref 0.0–0.2)

## 2021-02-20 LAB — BIRTH TISSUE RECOVERY COLLECTION (PLACENTA DONATION)

## 2021-02-20 NOTE — Progress Notes (Signed)
Patient is eating, ambulating, voiding.  Pain control is good.  Vitals:   02/19/21 1850 02/19/21 2018 02/20/21 0000 02/20/21 0400  BP: 117/70 123/84 127/83 120/65  Pulse: 67 68 79 70  Resp: 17 18 18 18   Temp: 98.8 F (37.1 C) 98.5 F (36.9 C) 98.3 F (36.8 C) 98.2 F (36.8 C)  TempSrc: Oral Oral Oral Oral  SpO2:  100% 100% 100%  Weight:      Height:        Fundus firm Perineum without swelling.  Lab Results  Component Value Date   WBC 11.0 (H) 02/20/2021   HGB 8.5 (L) 02/20/2021   HCT 25.6 (L) 02/20/2021   MCV 81.8 02/20/2021   PLT 215 02/20/2021    --/--/O POS (07/18 0751)/RI  A/P Post partum day 1.  Routine care.  Expect d/c routine, Iron for anemia.    10-14-1988

## 2021-02-20 NOTE — Discharge Summary (Signed)
Postpartum Discharge Summary  Date of Service updated      Patient Name: Joy Pena DOB: 03-09-1991 MRN: [id number]  Date of admission: 02/19/2021 Delivery date:02/19/2021  Delivering provider: Irene Pap E  Date of discharge: 02/20/2021  Admitting diagnosis: [redacted] weeks gestation of pregnancy [Z3A.39] Intrauterine pregnancy: [redacted]w[redacted]d    Secondary diagnosis:  Active Problems:   [redacted] weeks gestation of pregnancy  Additional problems: none    Discharge diagnosis: Term Pregnancy Delivered                                              Post partum procedures: none Augmentation: AROM and Pitocin Complications: None  Hospital course: Induction of Labor With Vaginal Delivery   30y.o. yo [id number] 326w2das admitted to the hospital 02/19/2021 for induction of labor.  Indication for induction: Elective.  Patient had an uncomplicated labor course as follows: Membrane Rupture Time/Date: 11:48 AM ,02/19/2021   Delivery Method:Vaginal, Spontaneous  Episiotomy: None  Lacerations:  2nd degree;Perineal  Details of delivery can be found in separate delivery note.  Patient had a routine postpartum course. Patient is discharged home 02/20/21.  Newborn Data: Birth date:02/19/2021  Birth time:5:01 PM  Gender:Female  Living status:Living  Apgars:8 ,9  Weight:3705 g   Magnesium Sulfate received: No BMZ received: No Rhophylac:No MMR:No T-DaP:Given prenatally Flu: No Transfusion:No  Physical exam  Vitals:   02/19/21 1850 02/19/21 2018 02/20/21 0000 02/20/21 0400  BP: 117/70 123/84 127/83 120/65  Pulse: 67 68 79 70  Resp: 17 18 18 18   Temp: 98.8 F (37.1 C) 98.5 F (36.9 C) 98.3 F (36.8 C) 98.2 F (36.8 C)  TempSrc: Oral Oral Oral Oral  SpO2:  100% 100% 100%  Weight:      Height:       Labs: Lab Results  Component Value Date   WBC 11.0 (H) 02/20/2021   HGB 8.5 (L) 02/20/2021   HCT 25.6 (L) 02/20/2021   MCV 81.8 02/20/2021   PLT 215 02/20/2021   CMP Latest Ref Rng &  Units 08/25/2020  Glucose 70 - 99 mg/dL 80  BUN 6 - 20 mg/dL 8  Creatinine 0.44 - 1.00 mg/dL 0.40(L)  Sodium 135 - 145 mmol/L 135  Potassium 3.5 - 5.1 mmol/L 3.7  Chloride 98 - 111 mmol/L 105  CO2 22 - 32 mmol/L 19(L)  Calcium 8.9 - 10.3 mg/dL 8.6(L)  Total Protein 6.5 - 8.1 g/dL 6.9  Total Bilirubin 0.3 - 1.2 mg/dL 0.3  Alkaline Phos 38 - 126 U/L 38  AST 15 - 41 U/L 16  ALT 0 - 44 U/L 13   Edinburgh Score: Edinburgh Postnatal Depression Scale Screening Tool 10/10/2017  I have been able to laugh and see the funny side of things. 0  I have looked forward with enjoyment to things. 0  I have blamed myself unnecessarily when things went wrong. 0  I have been anxious or worried for no good reason. 2  I have felt scared or panicky for no good reason. 0  Things have been getting on top of me. 0  I have been so unhappy that I have had difficulty sleeping. 0  I have felt sad or miserable. 0  I have been so unhappy that I have been crying. 0  The thought of harming myself has occurred to me. 0  EdFlavia Shipper  Postnatal Depression Scale Total 2      After visit meds:  Allergies as of 02/20/2021   No Known Allergies      Medication List     TAKE these medications    meclizine 12.5 MG tablet Commonly known as: ANTIVERT Take 1-2 tablets (12.5-25 mg total) by mouth 3 (three) times daily as needed for dizziness.   multivitamin-prenatal 27-0.8 MG Tabs tablet Take 1 tablet by mouth daily at 12 noon.   ondansetron 8 MG disintegrating tablet Commonly known as: Zofran ODT Take 1 tablet (8 mg total) by mouth every 8 (eight) hours as needed for nausea or vomiting.   promethazine 12.5 MG suppository Commonly known as: PHENERGAN Place 12.5 mg rectally every 6 (six) hours as needed for nausea or vomiting.         Discharge home in stable condition Infant Feeding: Breast Infant Disposition:home with mother Discharge instruction: per After Visit Summary and Postpartum booklet. Activity:  Advance as tolerated. Pelvic rest for 6 weeks.  Diet: routine diet Anticipated Birth Control: Unsure Postpartum Appointment:4 weeks Additional Postpartum F/U:  noner Future Appointments:No future appointments. Follow up Visit:  Follow-up Information     Taam-Akelman, Lawrence Santiago, MD Follow up in 4 week(s).   Specialty: Obstetrics and Gynecology Contact information: [address] [place] [address] [phone]                     02/20/2021 Daria Pastures, MD

## 2021-02-20 NOTE — Anesthesia Postprocedure Evaluation (Signed)
Anesthesia Post Note  Patient: Joy Pena  Procedure(s) Performed: AN AD HOC LABOR EPIDURAL     Patient location during evaluation: Mother Baby Anesthesia Type: Epidural Level of consciousness: awake Pain management: satisfactory to patient Vital Signs Assessment: post-procedure vital signs reviewed and stable Respiratory status: spontaneous breathing Cardiovascular status: stable Anesthetic complications: no   No notable events documented.  Last Vitals:  Vitals:   02/20/21 0400 02/20/21 0818  BP: 120/65 113/79  Pulse: 70 72  Resp: 18 20  Temp: 36.8 C 37 C  SpO2: 100% 100%    Last Pain:  Vitals:   02/20/21 0818  TempSrc: Oral  PainSc:    Pain Goal:                   [employer]

## 2021-03-05 ENCOUNTER — Telehealth (HOSPITAL_COMMUNITY): Payer: Self-pay | Admitting: *Deleted

## 2021-03-05 NOTE — Telephone Encounter (Signed)
Mom reports doing well. No concerns about herself. EPDS = 0 (Hospital score = 0) Mom reports baby is doing well. Had 2-week check up yesterday and everything looks good. No concerns about baby.  Duffy Rhody, RN 03/05/2021 at 9:56am

## 2021-06-28 IMAGING — US US MFM FETAL NUCHAL TRANSLUCENCY
1 series · 15 of 28 positions shown · non-contrast
Comparison: none

[Series 1: us mfm fetal nuchal translucency · 39 acquisitions, 15 frames shown]
[im 1/39]
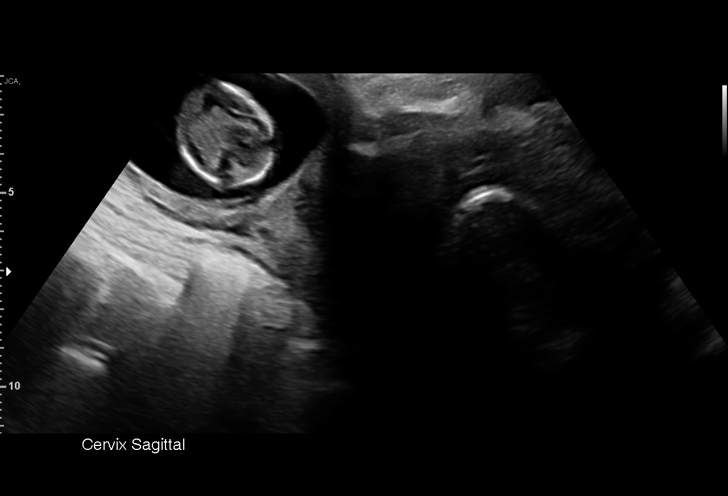
[im 3/39]
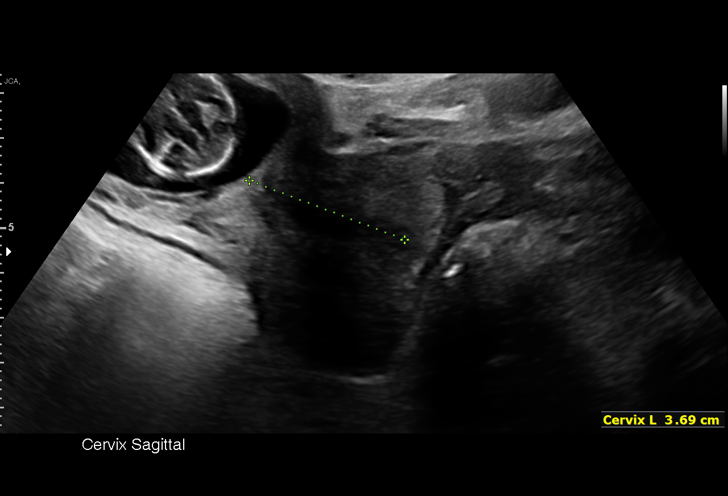
[im 6/39]
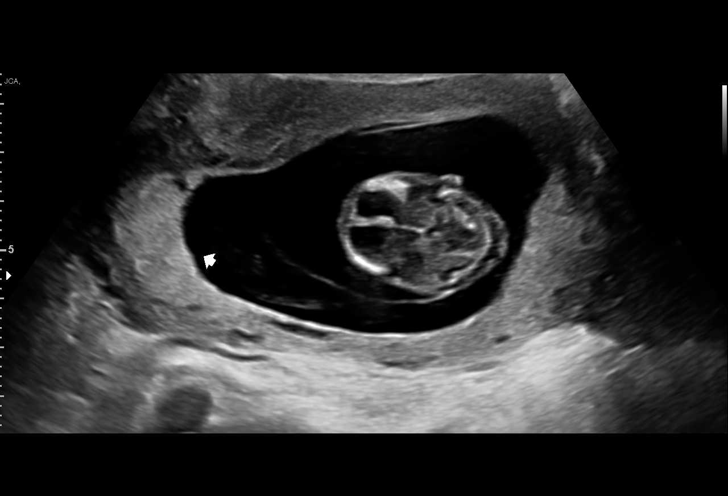
[im 9/39]
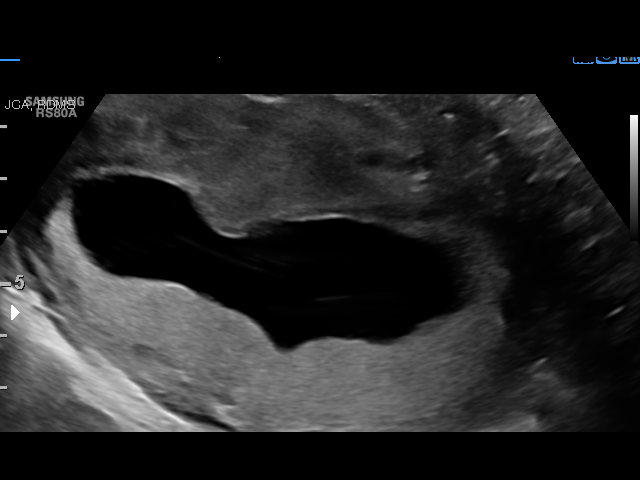
[im 12/39]
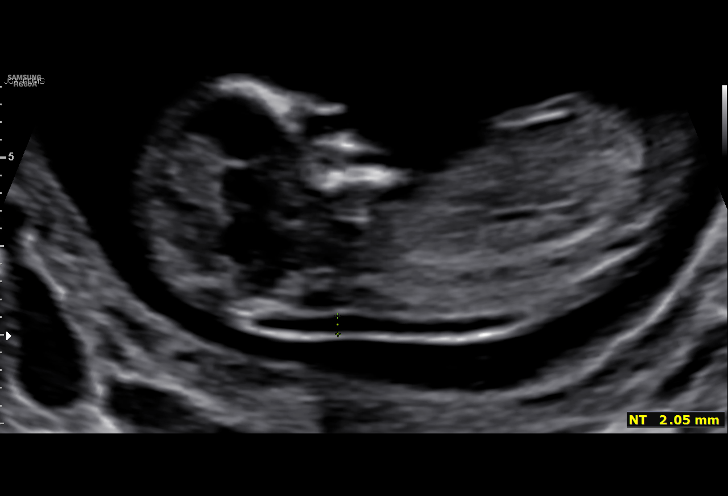
[im 15/39]
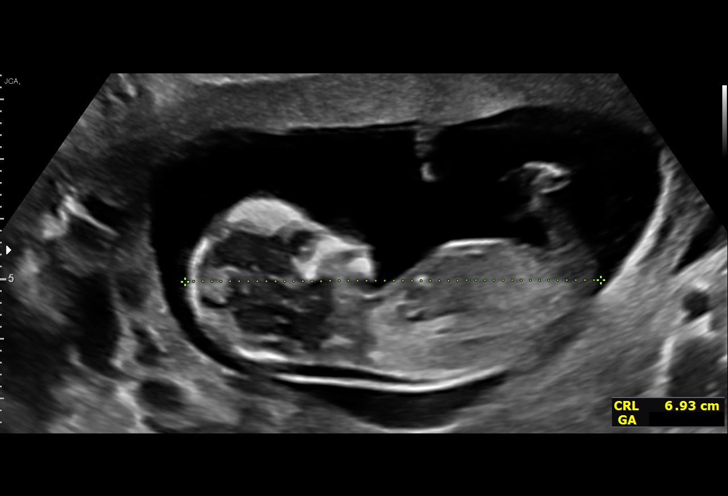
[im 17/39]
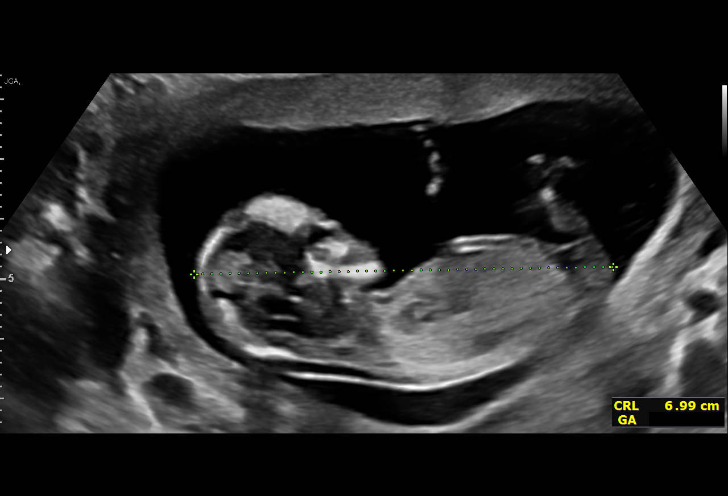
[im 20/39]
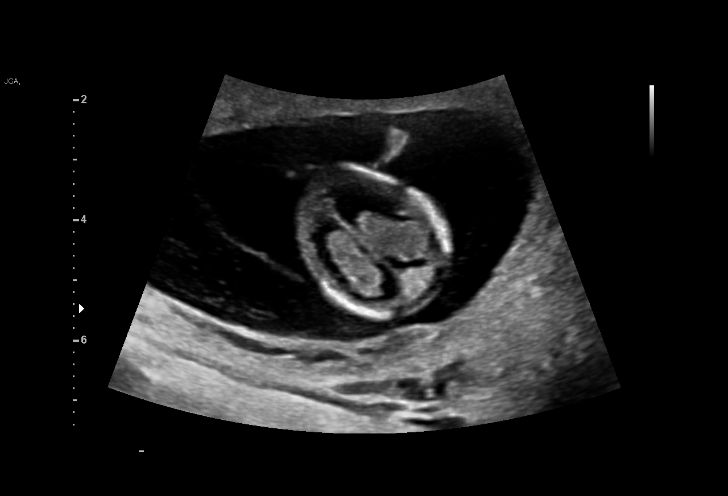
[im 22/39]
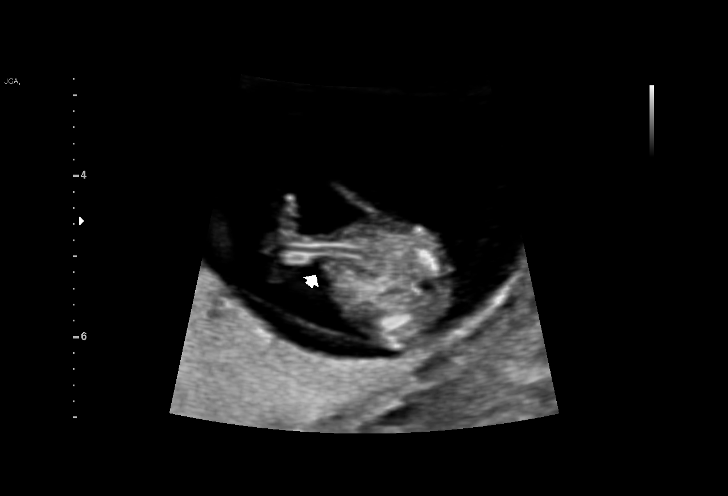
[im 24/39]
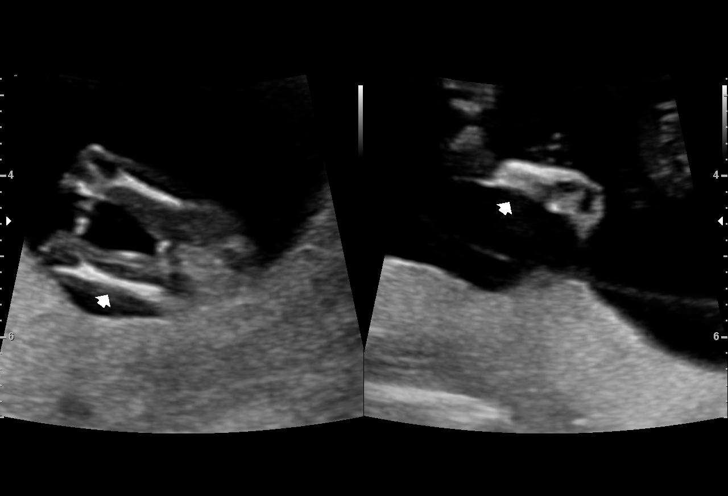
[im 27/39]
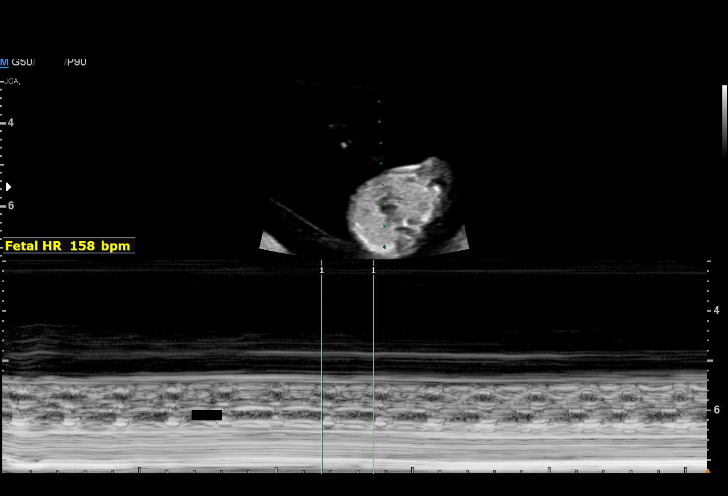
[im 30/39]
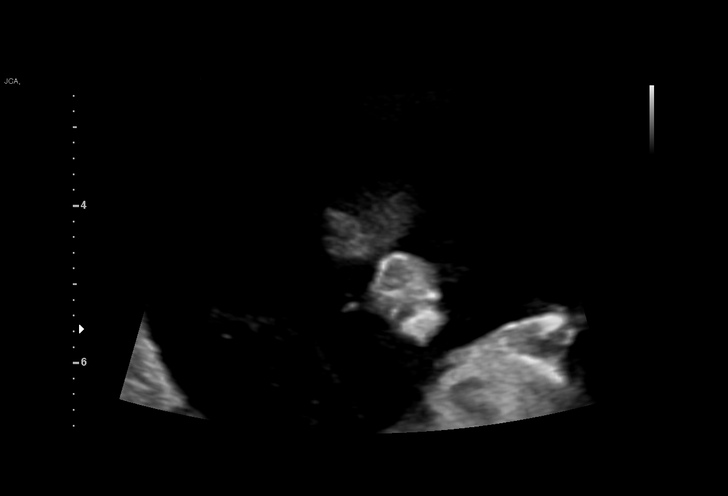
[im 33/39]
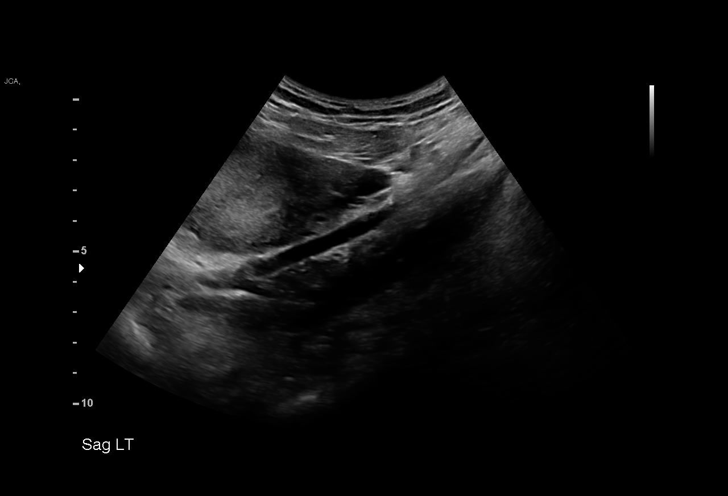
[im 36/39]
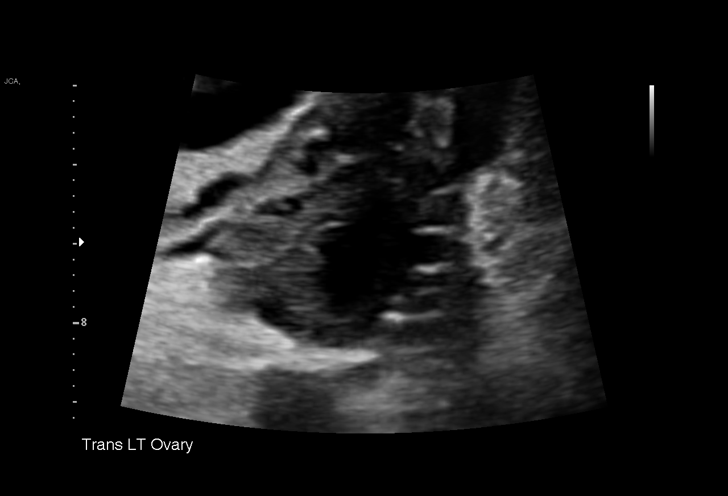
[im 39/39]
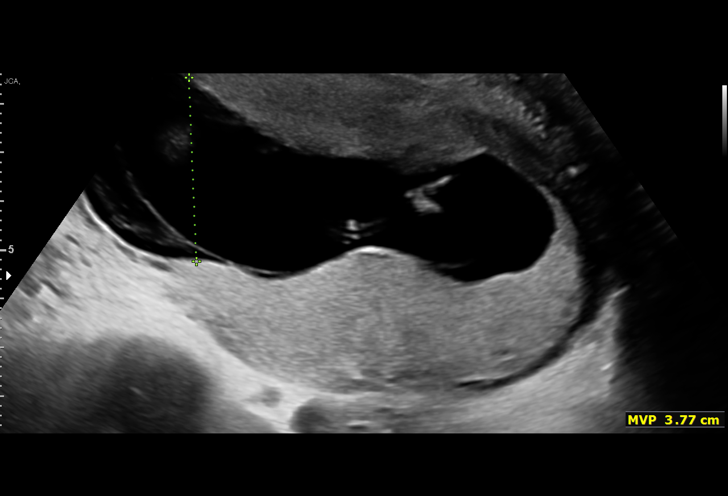

[15 of 28 positions shown; findings below may reference images not displayed]

TRANSLUCENCY

Indications

 12 weeks gestation of pregnancy
 Encounter for nuchal translucency
Fetal Evaluation

 Num Of Fetuses:         1
 Fetal Heart Rate(bpm):  158
 Cardiac Activity:       Observed
 Presentation:           Transverse, head to maternal right
 Placenta:               Right lateral
 P. Cord Insertion:      Not well visualized

 Amniotic Fluid
 AFI FV:      Within normal limits

                             Largest Pocket(cm)

Biometry

 CRL:      69.9  mm     G. Age:  13w 0d                  EDD:   02/21/21
 NT:       2.53  mm
OB History
 Gravidity:    5         Term:   2        Prem:   0        SAB:   2
 TOP:          0       Ectopic:  0        Living: 2
Gestational Age

 Best:          12w 4d     Det. By:  Previous Ultrasound      EDD:   02/24/21
                                     (07/06/20)
Anatomy

 Cranium:               Visualized             Diaphragm:              Visulaized
 Cavum:                 Visualized             Stomach:                Visualized
 Choroid Plexus:        Visualized             Abdominal Wall:         Visualized
 Face:                  Visualized             Upper Extremities:      Visualized
 Lips:                  Visualized             Lower Extremities:      Visualized

 Other:  Technically difficult due to early gestational age.
Cervix Uterus Adnexa

 Cervix
 Length:           3.51  cm.
 Normal appearance by transabdominal scan.

 Right Ovary
 Within normal limits.

 Left Ovary
 Within normal limits.
Impression

 Single intrauterine pregnancy with measurements consistent
 with dates.
 A nuchal translucency was performed within normal limits of

 I discussed with her that the exam was normal however, a
 detailed anatomical survey was precluded due to early
 gestational age.

 In addition, I reviewed the role of the NT and first trimester
 screen, Ms. Belcaid requested consideration for a cell free
 DNA. She met with our genetic counselor to discuss. Please
 see note for details.
Recommendations

 We scheduled Ms. Belcaid to return between 18-20 weeks for
 a detailed exam.

## 2023-05-10 ENCOUNTER — Emergency Department
Admission: EM | Admit: 2023-05-10 | Discharge: 2023-05-11 | Disposition: A | Discharge status: Home / Self Care | Payer: No Typology Code available for payment source | Attending: Emergency Medicine | Admitting: Emergency Medicine

## 2023-05-10 ENCOUNTER — Emergency Department: Payer: No Typology Code available for payment source

## 2023-05-10 ENCOUNTER — Other Ambulatory Visit: Payer: Self-pay

## 2023-05-10 DIAGNOSIS — R5383 Other fatigue: Secondary | ICD-10-CM | POA: Insufficient documentation

## 2023-05-10 DIAGNOSIS — R1030 Lower abdominal pain, unspecified: Secondary | ICD-10-CM | POA: Insufficient documentation

## 2023-05-10 DIAGNOSIS — R110 Nausea: Secondary | ICD-10-CM | POA: Diagnosis not present

## 2023-05-10 DIAGNOSIS — R109 Unspecified abdominal pain: Secondary | ICD-10-CM | POA: Diagnosis present

## 2023-05-10 LAB — URINALYSIS, ROUTINE W REFLEX MICROSCOPIC
Bilirubin Urine: NEGATIVE
Glucose, UA: NEGATIVE mg/dL
Hgb urine dipstick: NEGATIVE
Ketones, ur: NEGATIVE mg/dL
Leukocytes,Ua: NEGATIVE
Nitrite: NEGATIVE
Protein, ur: NEGATIVE mg/dL
Specific Gravity, Urine: 1.006 (ref 1.005–1.030)
pH: 7 (ref 5.0–8.0)

## 2023-05-10 LAB — CBC
HCT: 35.3 % — ABNORMAL LOW (ref 36.0–46.0)
Hemoglobin: 12.1 g/dL (ref 12.0–15.0)
MCH: 28.8 pg (ref 26.0–34.0)
MCHC: 34.3 g/dL (ref 30.0–36.0)
MCV: 84 fL (ref 80.0–100.0)
Platelets: 274 10*3/uL (ref 150–400)
RBC: 4.2 MIL/uL (ref 3.87–5.11)
RDW: 13.2 % (ref 11.5–15.5)
WBC: 9 10*3/uL (ref 4.0–10.5)
nRBC: 0 % (ref 0.0–0.2)

## 2023-05-10 LAB — COMPREHENSIVE METABOLIC PANEL
ALT: 14 U/L (ref 0–44)
AST: 18 U/L (ref 15–41)
Albumin: 4.6 g/dL (ref 3.5–5.0)
Alkaline Phosphatase: 44 U/L (ref 38–126)
Anion gap: 7 (ref 5–15)
BUN: 12 mg/dL (ref 6–20)
CO2: 23 mmol/L (ref 22–32)
Calcium: 8.9 mg/dL (ref 8.9–10.3)
Chloride: 105 mmol/L (ref 98–111)
Creatinine, Ser: 0.68 mg/dL (ref 0.44–1.00)
GFR, Estimated: >60 mL/min (ref >60)
Glucose, Bld: 102 mg/dL — ABNORMAL HIGH (ref 70–99)
Potassium: 3.6 mmol/L (ref 3.5–5.1)
Sodium: 135 mmol/L (ref 135–145)
Total Bilirubin: 0.6 mg/dL (ref 0.3–1.2)
Total Protein: 7.7 g/dL (ref 6.5–8.1)

## 2023-05-10 LAB — PREGNANCY, URINE: Preg Test, Ur: NEGATIVE

## 2023-05-10 LAB — LIPASE, BLOOD: Lipase: 31 U/L (ref 11–51)

## 2023-05-10 NOTE — ED Triage Notes (Signed)
Pt presents via POV c/o abd pain. Reports generalized abd pain radiating into her back. Pt states she was constipated yesterday however took suppository yesterday and had BM yesterday. Also reports SOB. Ambulatory to triage. A&O x4.

## 2023-05-11 NOTE — ED Provider Notes (Addendum)
[facility] Provider Note    Event Date/Time   First MD Initiated Contact with Patient 05/10/23 2204     (approximate)   History   Abdominal Pain   HPI  Joy Pena is a 32 y.o. female with a history of cyst who presents with lower abdominal pain since this morning, persistent course, somewhat radiating up to her right flank.  It was associated with some nausea and fatigue but no vomiting.  The patient denies any diarrhea, dysuria, frequency, or any vaginal bleeding.  She states that she previously has had flareups of pain due to possible ovarian cysts or endometriosis about once a month in between her periods.  This felt somewhat like one of these flareups, but the pain is more intense and involving more of her abdomen.  I reviewed the past medical records.  The patient's most recent prior encounter here was with L&D from her most recent childbirth in 2022.   Physical Exam   Triage Vital Signs: ED Triage Vitals [05/10/23 1922]  Encounter Vitals Group     BP (!) 152/91     Systolic BP Percentile      Diastolic BP Percentile      Pulse Rate 81     Resp 16     Temp 98.5 F (36.9 C)     Temp Source Oral     SpO2 100 %     Weight 150 lb (68 kg)     Height 5\' 2"  (1.575 m)     Head Circumference      Peak Flow      Pain Score 5     Pain Loc      Pain Education      Exclude from Growth Chart     Most recent vital signs: Vitals:   05/10/23 2217 05/10/23 2340  BP: (!) 126/91   Pulse: 75   Resp: 16   Temp:  97.9 F (36.6 C)  SpO2: 100%      General: Alert, well-appearing, no distress.  CV:  Good peripheral perfusion.  Resp:  Normal effort.  Abd:  Soft with mild bilateral lower quadrant discomfort but no focal tenderness.  No distention.  Other:  No jaundice or scleral icterus.   ED Results / Procedures / Treatments   Labs (all labs ordered are listed, but only abnormal results are displayed) Labs Reviewed  COMPREHENSIVE METABOLIC  PANEL - Abnormal; Notable for the following components:      Result Value   Glucose, Bld 102 (*)    All other components within normal limits  CBC - Abnormal; Notable for the following components:   HCT 35.3 (*)    All other components within normal limits  URINALYSIS, ROUTINE W REFLEX MICROSCOPIC - Abnormal; Notable for the following components:   Color, Urine STRAW (*)    APPearance CLEAR (*)    All other components within normal limits  LIPASE, BLOOD  PREGNANCY, URINE     EKG     RADIOLOGY  US pelvis:   IMPRESSION:  Unremarkable pelvic ultrasound.     PROCEDURES:  Critical Care performed: No  Procedures   MEDICATIONS ORDERED IN ED: Medications - No data to display   IMPRESSION / MDM / ASSESSMENT AND PLAN / ED COURSE  I reviewed the triage vital signs and the nursing notes.  32 year old female with PMH as noted above including ovarian cyst and possible endometriosis presents with lower abdominal pain since this morning.  She has  had similar episodes previously, but this episode is more intense and the pain is involving a larger part of her abdomen.  She has some pain rating up towards her right flank.  She is status post appendectomy.  On exam there is mild bilateral lower quadrant tenderness.  Differential diagnosis includes, but is not limited to, ovarian cyst, cyst rupture, endometriosis, fibroids.  I have a low suspicion for ovarian torsion given that the patient's pain is controlled with ibuprofen and she is well-appearing.  Lab workup is unremarkable.  CBC shows no leukocytosis.  Electrolytes are normal.  Urinalysis shows no acute findings.  There is no evidence of PID or other infectious etiology.  The patient has no significant GI symptoms to suggest colitis, diverticulitis, or other GI etiology.  Given the negative urinalysis, ureteral stone is unlikely.  We will obtain an ultrasound for further evaluation.  Patient's presentation is most consistent with  acute complicated illness / injury requiring diagnostic workup.  ----------------------------------------- 12:15 AM on 05/11/2023 -----------------------------------------  Ultrasound is negative.  The patient has not required any pain medication in the ED.  She is stable for discharge home.  I have advised her to follow-up with her OB/GYN.  I gave strict return precautions and she expresses understanding.   FINAL CLINICAL IMPRESSION(S) / ED DIAGNOSES   Final diagnoses:  Lower abdominal pain     Rx / DC Orders   ED Discharge Orders     None        Note:  This document was prepared using Dragon voice recognition software and may include unintentional dictation errors.    Dionne Bucy, MD 05/11/23 Orpah Cobb    Dionne Bucy, MD 05/11/23 [phone]

## 2023-05-11 NOTE — Discharge Instructions (Signed)
Follow-up with your OB/GYN.  Continue taking ibuprofen as needed.  Return to the ER for new, worsening, or persistent severe abdominal pain, nausea or vomiting, vaginal bleeding, or any other new or worsening symptoms that concern you.

## 2023-11-21 DIAGNOSIS — F902 Attention-deficit hyperactivity disorder, combined type: Secondary | ICD-10-CM | POA: Diagnosis not present

## 2023-12-05 DIAGNOSIS — E063 Autoimmune thyroiditis: Secondary | ICD-10-CM | POA: Diagnosis not present

## 2023-12-05 DIAGNOSIS — Z131 Encounter for screening for diabetes mellitus: Secondary | ICD-10-CM | POA: Diagnosis not present

## 2023-12-05 DIAGNOSIS — N951 Menopausal and female climacteric states: Secondary | ICD-10-CM | POA: Diagnosis not present

## 2023-12-05 DIAGNOSIS — R5382 Chronic fatigue, unspecified: Secondary | ICD-10-CM | POA: Diagnosis not present

## 2023-12-05 DIAGNOSIS — E559 Vitamin D deficiency, unspecified: Secondary | ICD-10-CM | POA: Diagnosis not present

## 2023-12-05 DIAGNOSIS — Z136 Encounter for screening for cardiovascular disorders: Secondary | ICD-10-CM | POA: Diagnosis not present

## 2023-12-09 DIAGNOSIS — Z1339 Encounter for screening examination for other mental health and behavioral disorders: Secondary | ICD-10-CM | POA: Diagnosis not present

## 2023-12-09 DIAGNOSIS — N951 Menopausal and female climacteric states: Secondary | ICD-10-CM | POA: Diagnosis not present

## 2023-12-09 DIAGNOSIS — E786 Lipoprotein deficiency: Secondary | ICD-10-CM | POA: Diagnosis not present

## 2023-12-09 DIAGNOSIS — R635 Abnormal weight gain: Secondary | ICD-10-CM | POA: Diagnosis not present

## 2023-12-09 DIAGNOSIS — R5382 Chronic fatigue, unspecified: Secondary | ICD-10-CM | POA: Diagnosis not present

## 2023-12-09 DIAGNOSIS — E559 Vitamin D deficiency, unspecified: Secondary | ICD-10-CM | POA: Diagnosis not present

## 2023-12-09 DIAGNOSIS — Z1331 Encounter for screening for depression: Secondary | ICD-10-CM | POA: Diagnosis not present

## 2023-12-09 DIAGNOSIS — E663 Overweight: Secondary | ICD-10-CM | POA: Diagnosis not present

## 2023-12-09 DIAGNOSIS — R0683 Snoring: Secondary | ICD-10-CM | POA: Diagnosis not present

## 2023-12-09 DIAGNOSIS — M2550 Pain in unspecified joint: Secondary | ICD-10-CM | POA: Diagnosis not present

## 2023-12-16 DIAGNOSIS — E559 Vitamin D deficiency, unspecified: Secondary | ICD-10-CM | POA: Diagnosis not present

## 2023-12-16 DIAGNOSIS — N951 Menopausal and female climacteric states: Secondary | ICD-10-CM | POA: Diagnosis not present

## 2023-12-16 DIAGNOSIS — Z6826 Body mass index (BMI) 26.0-26.9, adult: Secondary | ICD-10-CM | POA: Diagnosis not present

## 2023-12-16 DIAGNOSIS — Z79890 Hormone replacement therapy (postmenopausal): Secondary | ICD-10-CM | POA: Diagnosis not present

## 2023-12-22 DIAGNOSIS — Z6825 Body mass index (BMI) 25.0-25.9, adult: Secondary | ICD-10-CM | POA: Diagnosis not present

## 2023-12-22 DIAGNOSIS — E559 Vitamin D deficiency, unspecified: Secondary | ICD-10-CM | POA: Diagnosis not present

## 2023-12-31 DIAGNOSIS — E559 Vitamin D deficiency, unspecified: Secondary | ICD-10-CM | POA: Diagnosis not present

## 2023-12-31 DIAGNOSIS — Z6826 Body mass index (BMI) 26.0-26.9, adult: Secondary | ICD-10-CM | POA: Diagnosis not present

## 2023-12-31 DIAGNOSIS — E063 Autoimmune thyroiditis: Secondary | ICD-10-CM | POA: Diagnosis not present

## 2024-01-05 DIAGNOSIS — E559 Vitamin D deficiency, unspecified: Secondary | ICD-10-CM | POA: Diagnosis not present

## 2024-01-05 DIAGNOSIS — E063 Autoimmune thyroiditis: Secondary | ICD-10-CM | POA: Diagnosis not present

## 2024-01-05 DIAGNOSIS — Z6825 Body mass index (BMI) 25.0-25.9, adult: Secondary | ICD-10-CM | POA: Diagnosis not present

## 2024-01-14 DIAGNOSIS — E559 Vitamin D deficiency, unspecified: Secondary | ICD-10-CM | POA: Diagnosis not present

## 2024-01-14 DIAGNOSIS — E063 Autoimmune thyroiditis: Secondary | ICD-10-CM | POA: Diagnosis not present

## 2024-01-14 DIAGNOSIS — N951 Menopausal and female climacteric states: Secondary | ICD-10-CM | POA: Diagnosis not present

## 2024-01-14 DIAGNOSIS — Z6825 Body mass index (BMI) 25.0-25.9, adult: Secondary | ICD-10-CM | POA: Diagnosis not present

## 2024-01-19 DIAGNOSIS — Z6825 Body mass index (BMI) 25.0-25.9, adult: Secondary | ICD-10-CM | POA: Diagnosis not present

## 2024-01-19 DIAGNOSIS — N951 Menopausal and female climacteric states: Secondary | ICD-10-CM | POA: Diagnosis not present

## 2024-01-19 DIAGNOSIS — E063 Autoimmune thyroiditis: Secondary | ICD-10-CM | POA: Diagnosis not present

## 2024-01-19 DIAGNOSIS — E559 Vitamin D deficiency, unspecified: Secondary | ICD-10-CM | POA: Diagnosis not present

## 2024-06-08 DIAGNOSIS — Z01419 Encounter for gynecological examination (general) (routine) without abnormal findings: Secondary | ICD-10-CM | POA: Diagnosis not present

## 2024-06-22 DIAGNOSIS — R102 Pelvic and perineal pain unspecified side: Secondary | ICD-10-CM | POA: Diagnosis not present

## 2024-07-08 ENCOUNTER — Other Ambulatory Visit (HOSPITAL_BASED_OUTPATIENT_CLINIC_OR_DEPARTMENT_OTHER): Payer: Self-pay

## 2024-07-08 MED ORDER — ETONOGESTREL-ETHINYL ESTRADIOL 0.12-0.015 MG/24HR VA RING
VAGINAL_RING | VAGINAL | 4 refills | Status: AC
  Filled 2024-07-08: qty 3, 90d supply, fill #0

## 2024-07-12 ENCOUNTER — Encounter: Payer: Self-pay | Admitting: Nurse Practitioner

## 2024-07-12 ENCOUNTER — Other Ambulatory Visit (HOSPITAL_BASED_OUTPATIENT_CLINIC_OR_DEPARTMENT_OTHER): Payer: Self-pay

## 2024-07-12 ENCOUNTER — Ambulatory Visit: Admitting: Nurse Practitioner

## 2024-07-12 VITALS — BP 116/78 | HR 76 | Temp 98.2°F | Ht 62.0 in | Wt 135.1 lb

## 2024-07-12 DIAGNOSIS — G43109 Migraine with aura, not intractable, without status migrainosus: Secondary | ICD-10-CM | POA: Diagnosis not present

## 2024-07-12 DIAGNOSIS — R7989 Other specified abnormal findings of blood chemistry: Secondary | ICD-10-CM

## 2024-07-12 DIAGNOSIS — R41840 Attention and concentration deficit: Secondary | ICD-10-CM | POA: Diagnosis not present

## 2024-07-12 DIAGNOSIS — Z0000 Encounter for general adult medical examination without abnormal findings: Secondary | ICD-10-CM

## 2024-07-12 DIAGNOSIS — H8110 Benign paroxysmal vertigo, unspecified ear: Secondary | ICD-10-CM | POA: Diagnosis not present

## 2024-07-12 DIAGNOSIS — Z862 Personal history of diseases of the blood and blood-forming organs and certain disorders involving the immune mechanism: Secondary | ICD-10-CM

## 2024-07-12 DIAGNOSIS — Z13220 Encounter for screening for lipoid disorders: Secondary | ICD-10-CM

## 2024-07-12 DIAGNOSIS — Z131 Encounter for screening for diabetes mellitus: Secondary | ICD-10-CM | POA: Diagnosis not present

## 2024-07-12 LAB — LIPID PANEL
Cholesterol: 165 mg/dL (ref 0–200)
HDL: 43.7 mg/dL (ref >39.00)
LDL Cholesterol: 96 mg/dL (ref 0–99)
NonHDL: 121.19
Total CHOL/HDL Ratio: 4
Triglycerides: 125 mg/dL (ref 0.0–149.0)
VLDL: 25 mg/dL (ref 0.0–40.0)

## 2024-07-12 LAB — BASIC METABOLIC PANEL WITH GFR
BUN: 16 mg/dL (ref 6–23)
CO2: 23 meq/L (ref 19–32)
Calcium: 9 mg/dL (ref 8.4–10.5)
Chloride: 106 meq/L (ref 96–112)
Creatinine, Ser: 0.64 mg/dL (ref 0.40–1.20)
GFR: 116.66 mL/min (ref >60.00)
Glucose, Bld: 97 mg/dL (ref 70–99)
Potassium: 4.3 meq/L (ref 3.5–5.1)
Sodium: 138 meq/L (ref 135–145)

## 2024-07-12 LAB — CBC
HCT: 33 % — ABNORMAL LOW (ref 36.0–46.0)
Hemoglobin: 11.3 g/dL — ABNORMAL LOW (ref 12.0–15.0)
MCHC: 34.2 g/dL (ref 30.0–36.0)
MCV: 85.1 fl (ref 78.0–100.0)
Platelets: 261 K/uL (ref 150.0–400.0)
RBC: 3.88 Mil/uL (ref 3.87–5.11)
RDW: 13.8 % (ref 11.5–15.5)
WBC: 5.4 K/uL (ref 4.0–10.5)

## 2024-07-12 LAB — TSH: TSH: 1.39 u[IU]/mL (ref 0.35–5.50)

## 2024-07-12 LAB — HEMOGLOBIN A1C: Hgb A1c MFr Bld: 5.3 % (ref 4.6–6.5)

## 2024-07-12 MED ORDER — NURTEC 75 MG PO TBDP
1.0000 | ORAL_TABLET | Freq: Every day | ORAL | 0 refills | Status: AC | PRN
  Filled 2024-07-12: qty 8, 16d supply, fill #0

## 2024-07-12 MED ORDER — MECLIZINE HCL 12.5 MG PO TABS
12.5000 mg | ORAL_TABLET | Freq: Three times a day (TID) | ORAL | 0 refills | Status: AC | PRN
  Filled 2024-07-12: qty 30, 10d supply, fill #0

## 2024-07-12 NOTE — Assessment & Plan Note (Signed)
 History of same and infrequent in nature.  Patient has tried Nurtec in the past with benefit.  Will prescribe Nurtec 1 tablet daily as needed headache.  Patient has not tried any other abortive therapies in the past

## 2024-07-12 NOTE — Assessment & Plan Note (Addendum)
 Patient's children and husband have been diagnosed with ADHD.  Patient thinks she has it too,but as of late with 3 kids back in the career having more difficulty focusing and staying on task.  Ambulatory referral to psychology for official ADD/ADHD testing.

## 2024-07-12 NOTE — Progress Notes (Signed)
 New Patient Office Visit  Subjective    Patient ID: Joy Pena, female    DOB: 1991/04/05  Age: 33 y.o. MRN: [id number]  CC:  Chief Complaint  Patient presents with   New Patient (Initial Visit)    HPI Joy Pena presents to establish care   GYN is  Dr. Sarrah at [facility]   Headaches: states that she hasd a PCP that left and never got another one. Stats that she went to GYN for the last year. States that she has some dizzy spells for the past year and a half. States that she is also having headaches. Statse that she was pulled off of OCP. States that she has some dizzy spells that can varying in length. Stats that it can be a few minutes to longer periods of time. Statse that a headache can happen after. Statse that she will have bilateral frontal headaches that can run to the back of the head and down the neck. States that she has been having a stressful year.  She has tried nurtec and ondansetron  that has helped with the headaches and some of the dizziness.   She will have some nausea. She will have some light sensitivy and the sounds are not too bad.   Does have history of tubes I her ears. She will do tylenol  and excedrin that will help   States that she will drink at least 80 0z  a ady States that she will do 3 meals a day and sometimes a snack  She will get 6-8 hours of sleep in a night.  Tdap:2022 Flu: through employer, up to date HPV: up to date    Outpatient Encounter Medications as of 07/12/2024  Medication Sig   etonogestrel -ethinyl estradiol  (NUVARING) 0.12-0.015 MG/24HR vaginal ring Insert 1 ring vaginally every month continuously (no cycle)   meclizine  (ANTIVERT ) 12.5 MG tablet Take 1 tablet (12.5 mg total) by mouth 3 (three) times daily as needed for dizziness.   Rimegepant Sulfate  (NURTEC) 75 MG TBDP Take 1 tablet (75 mg total) by mouth daily as needed.   [DISCONTINUED] meclizine  (ANTIVERT ) 12.5 MG tablet Take 1-2 tablets (12.5-25 mg total) by  mouth 3 (three) times daily as needed for dizziness.   [DISCONTINUED] ondansetron  (ZOFRAN  ODT) 8 MG disintegrating tablet Take 1 tablet (8 mg total) by mouth every 8 (eight) hours as needed for nausea or vomiting.   [DISCONTINUED] Prenatal Vit-Fe Fumarate-FA (MULTIVITAMIN-PRENATAL) 27-0.8 MG TABS tablet Take 1 tablet by mouth daily at 12 noon.   [DISCONTINUED] promethazine  (PHENERGAN ) 12.5 MG suppository Place 12.5 mg rectally every 6 (six) hours as needed for nausea or vomiting.   No facility-administered encounter medications on file as of 07/12/2024.    Past Medical History:  Diagnosis Date   Anemia    Hemorrhoid    PONV (postoperative nausea and vomiting)    UTI (lower urinary tract infection)     Past Surgical History:  Procedure Laterality Date   APPENDECTOMY     BREAST SURGERY     Breast reduction   CLOSED MANIPULATION KNEE WITH STERIOD INJECTION Left 10/29/2018   Procedure: CLOSED MANIPULATION KNEE WITH STEROID INJECTION;  Surgeon: Cristy Bonner DASEN, MD;  Location: [facility];  Service: Orthopedics;  Laterality: Left;   KNEE ARTHROSCOPY Left 08/27/2018   Procedure: ARTHROSCOPY KNEE WITH LYSIS OF ADHESIONS AND MANIPULATION;  Surgeon: Cristy Bonner DASEN, MD;  Location: [facility];  Service: Orthopedics;  Laterality: Left;   TONSILLECTOMY  WISDOM TOOTH EXTRACTION      Family History  Problem Relation Age of Onset   Cancer Maternal Grandfather     Social History   Socioeconomic History   Marital status: Married    Spouse name: Matt   Number of children: 3   Years of education: Not on file   Highest education level: Not on file  Occupational History   Not on file  Tobacco Use   Smoking status: Never   Smokeless tobacco: Never  Vaping Use   Vaping status: Never Used  Substance and Sexual Activity   Alcohol use: No   Drug use: No   Sexual activity: Not Currently  Other Topics Concern   Not on file  Social History Narrative   Fulltime:  Psychologist, 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  Respiratory:  Negative for shortness of breath.   Cardiovascular:  Negative for chest pain and leg swelling.  Gastrointestinal:  Negative for abdominal pain, blood in stool, constipation, diarrhea, nausea and vomiting.       BM daily to every other day   Genitourinary:  Negative for dysuria and hematuria.  Neurological:  Positive for dizziness and headaches. Negative for tingling.  Psychiatric/Behavioral:  Negative for hallucinations and suicidal ideas.         Objective    BP 116/78   Pulse 76   Temp 98.2 F (36.8 C) (Oral)   Ht 5' 2 (1.575 m)   Wt 135 lb 2 oz (61.3 kg)   SpO2 99%   BMI 24.71 kg/m   Physical Exam Vitals and nursing note reviewed.  Constitutional:      Appearance: Normal appearance.  HENT:     Head:     Comments: Scarring TMs.  Left worse than right    Right Ear: Tympanic membrane, ear canal and external ear normal.     Left Ear: Tympanic membrane, ear canal and external ear normal.     Mouth/Throat:     Mouth: Mucous membranes are moist.     Pharynx: Oropharynx is clear.  Eyes:     Extraocular Movements: Extraocular movements intact.     Pupils: Pupils are equal, round, and reactive to light.  Cardiovascular:     Rate and Rhythm: Normal rate and regular rhythm.     Pulses: Normal pulses.     Heart sounds: Normal heart sounds.  Pulmonary:     Effort: Pulmonary effort is normal.     Breath sounds: Normal breath sounds.  Musculoskeletal:     Right lower leg: No edema.     Left lower leg: No edema.  Lymphadenopathy:     Cervical: No cervical  adenopathy.  Skin:    General: Skin is warm.  Neurological:     General: No focal deficit present.     Mental Status: She is alert.     Deep Tendon Reflexes:     Reflex Scores:      Bicep reflexes are 2+ on the right side and 2+ on the left side.  Patellar reflexes are 2+ on the right side and 2+ on the left side.    Comments: Bilateral upper and lower extremity strength 5/5  Psychiatric:        Mood and Affect: Mood normal.        Behavior: Behavior normal.        Thought Content: Thought content normal.        Judgment: Judgment normal.         Assessment & Plan:   Problem List Items Addressed This Visit       Cardiovascular and Mediastinum   Migraine with aura and without status migrainosus, not intractable - Primary   History of same and infrequent in nature.  Patient has tried Nurtec in the past with benefit.  Will prescribe Nurtec 1 tablet daily as needed headache.  Patient has not tried any other abortive therapies in the past      Relevant Medications   Rimegepant Sulfate  (NURTEC) 75 MG TBDP   Other Relevant Orders   CBC   Basic metabolic panel with GFR   TSH     Nervous and Auditory   Benign paroxysmal positional vertigo   Neurological exam benign in office.  This is worse with sudden movements per patient report.  Will do meclizine  12.5 mg 3 times daily as needed      Relevant Medications   meclizine  (ANTIVERT ) 12.5 MG tablet   Other Relevant Orders   CBC   Basic metabolic panel with GFR   TSH     Other   Concentration deficit   Patient's children and husband have been diagnosed with ADHD.  Patient thinks she has it too,but as of late with 3 kids back in the career having more difficulty focusing and staying on task.  Ambulatory referral to psychology for official ADD/ADHD testing.      Relevant Orders   Ambulatory referral to Psychology   Other Visit Diagnoses       Encounter for medical examination to establish care         History of  anemia       Relevant Orders   CBC     Screening for lipid disorders       Relevant Orders   Lipid panel     Screening for diabetes mellitus       Relevant Orders   Hemoglobin A1c       Return in about 1 year (around 07/12/2025) for CPE and Labs.   Adina Crandall, NP

## 2024-07-12 NOTE — Assessment & Plan Note (Signed)
 Neurological exam benign in office.  This is worse with sudden movements per patient report.  Will do meclizine  12.5 mg 3 times daily as needed

## 2024-07-12 NOTE — Patient Instructions (Signed)
 Nice to see you today I have sent in some medication to the pharmacy  Follow up with me if you do not improve

## 2024-07-13 ENCOUNTER — Telehealth: Payer: Self-pay

## 2024-07-13 ENCOUNTER — Ambulatory Visit: Payer: Self-pay | Admitting: Nurse Practitioner

## 2024-07-13 ENCOUNTER — Other Ambulatory Visit (HOSPITAL_COMMUNITY): Payer: Self-pay

## 2024-07-13 ENCOUNTER — Ambulatory Visit

## 2024-07-13 DIAGNOSIS — R7989 Other specified abnormal findings of blood chemistry: Secondary | ICD-10-CM

## 2024-07-13 LAB — IBC + FERRITIN
Ferritin: 22.8 ng/mL (ref 10.0–291.0)
Iron: 88 ug/dL (ref 42–145)
Saturation Ratios: 22 % (ref 20.0–50.0)
TIBC: 400.4 ug/dL (ref 250.0–450.0)
Transferrin: 286 mg/dL (ref 212.0–360.0)

## 2024-07-13 NOTE — Addendum Note (Signed)
 Addended by: ISADORA RAISIN on: 07/13/2024 10:06 AM   Modules accepted: Orders

## 2024-07-13 NOTE — Telephone Encounter (Signed)
 Pharmacy Patient Advocate Encounter   Received notification from Onbase that prior authorization for Nurtec 75 is required/requested.   Insurance verification completed.   The patient is insured through [facility].   Per test claim: PA required; PA submitted to above mentioned insurance via Latent Key/confirmation #/EOC [id number] Status is pending

## 2024-07-15 ENCOUNTER — Other Ambulatory Visit (HOSPITAL_BASED_OUTPATIENT_CLINIC_OR_DEPARTMENT_OTHER): Payer: Self-pay

## 2024-07-15 ENCOUNTER — Other Ambulatory Visit (HOSPITAL_COMMUNITY): Payer: Self-pay

## 2024-07-15 MED ORDER — SUMATRIPTAN SUCCINATE 50 MG PO TABS
ORAL_TABLET | ORAL | 0 refills | Status: DC
  Filled 2024-07-15: qty 10, 31d supply, fill #0

## 2024-07-15 NOTE — Telephone Encounter (Signed)
 Pharmacy Patient Advocate Encounter  Received notification from [employer] that Prior Authorization for Nurtec 75 has been DENIED.  Full denial letter will be uploaded to the media tab. See denial reason below.     PA #/Case ID/Reference #: # [id number]

## 2024-07-15 NOTE — Telephone Encounter (Signed)
 Can we let the patient know that the insurance denied nurtec. We have to try a another medication first. This is call a triptan. I will send in sumatriptan 50 mg tablets she can take on at onset of headache and may repeat in 2 hours if headache persists. No more than 2 tablets in 24 hours. This can cause her to feel sleepy

## 2024-07-16 NOTE — Telephone Encounter (Signed)
 Left voicemail for patient to call the office back.

## 2024-07-19 ENCOUNTER — Ambulatory Visit: Payer: Self-pay | Admitting: Nurse Practitioner

## 2024-07-19 ENCOUNTER — Other Ambulatory Visit (HOSPITAL_BASED_OUTPATIENT_CLINIC_OR_DEPARTMENT_OTHER): Payer: Self-pay

## 2024-07-19 NOTE — Telephone Encounter (Signed)
 Called and spoke with patient. Relayed information regarding alternative medication. Pt understood. Verbalized understanding and did not state any questions or concerns.

## 2024-07-21 ENCOUNTER — Other Ambulatory Visit (HOSPITAL_BASED_OUTPATIENT_CLINIC_OR_DEPARTMENT_OTHER): Payer: Self-pay

## 2024-07-21 DIAGNOSIS — F902 Attention-deficit hyperactivity disorder, combined type: Secondary | ICD-10-CM | POA: Diagnosis not present

## 2024-07-21 DIAGNOSIS — F330 Major depressive disorder, recurrent, mild: Secondary | ICD-10-CM | POA: Diagnosis not present

## 2024-07-21 DIAGNOSIS — F411 Generalized anxiety disorder: Secondary | ICD-10-CM | POA: Diagnosis not present

## 2024-07-21 MED ORDER — AMPHETAMINE-DEXTROAMPHET ER 10 MG PO CP24
10.0000 mg | ORAL_CAPSULE | Freq: Every morning | ORAL | 0 refills | Status: AC
  Filled 2024-07-21: qty 30, 30d supply, fill #0

## 2024-07-21 MED ORDER — FLUOXETINE HCL 10 MG PO CAPS
10.0000 mg | ORAL_CAPSULE | Freq: Every morning | ORAL | 0 refills | Status: AC
  Filled 2024-07-21: qty 7, 7d supply, fill #0

## 2024-07-21 MED ORDER — FLUOXETINE HCL 20 MG PO CAPS
20.0000 mg | ORAL_CAPSULE | Freq: Every day | ORAL | 0 refills | Status: DC
  Filled 2024-07-21: qty 30, 30d supply, fill #0

## 2024-07-26 ENCOUNTER — Telehealth: Payer: Self-pay | Admitting: Nurse Practitioner

## 2024-07-26 DIAGNOSIS — G43109 Migraine with aura, not intractable, without status migrainosus: Secondary | ICD-10-CM

## 2024-07-26 NOTE — Telephone Encounter (Signed)
 Left voicemail for patient to call the office back.

## 2024-07-26 NOTE — Telephone Encounter (Signed)
-----   Message from Lynwood Crandall, NP sent at 07/12/2024 10:52 AM EST ----- Regarding: Vertigo/headaches Can we call and see if the meclzine and nurtec has been helpful

## 2024-07-26 NOTE — Telephone Encounter (Signed)
 Can we call and see if the meclzine was helpful

## 2024-07-27 NOTE — Telephone Encounter (Signed)
 Left voicemail for patient to call the office back.

## 2024-07-28 NOTE — Telephone Encounter (Signed)
 Patient called into office Per patient the Nurtec was not covered by insurance and the other medication made her sleepy and did not really help her.

## 2024-07-28 NOTE — Telephone Encounter (Signed)
 Was she able to get the imitrix (sumatriptan ) has it helped?

## 2024-07-28 NOTE — Telephone Encounter (Signed)
 Left voicemail for patient to call the office back.

## 2024-07-30 ENCOUNTER — Other Ambulatory Visit (HOSPITAL_BASED_OUTPATIENT_CLINIC_OR_DEPARTMENT_OTHER): Payer: Self-pay

## 2024-07-30 MED ORDER — NURTEC 75 MG PO TBDP
75.0000 mg | ORAL_TABLET | Freq: Every day | ORAL | 0 refills | Status: AC | PRN
  Filled 2024-07-30: qty 16, 30d supply, fill #0

## 2024-07-30 NOTE — Addendum Note (Signed)
 Addended by: WENDEE LYNWOOD HERO on: 07/30/2024 01:02 PM   Modules accepted: Orders

## 2024-07-30 NOTE — Telephone Encounter (Signed)
 See mychart message. I am going to resubmit nurtec

## 2024-08-02 ENCOUNTER — Other Ambulatory Visit (HOSPITAL_BASED_OUTPATIENT_CLINIC_OR_DEPARTMENT_OTHER): Payer: Self-pay

## 2024-08-02 ENCOUNTER — Telehealth: Payer: Self-pay

## 2024-08-02 ENCOUNTER — Other Ambulatory Visit (HOSPITAL_COMMUNITY): Payer: Self-pay

## 2024-08-02 NOTE — Telephone Encounter (Signed)
 Pharmacy Patient Advocate Encounter   Received notification from Pt Calls Messages that prior authorization for Nurtec 75 is required/requested.   Insurance verification completed.   The patient is insured through [facility].   Per test claim: PA required; PA submitted to above mentioned insurance via Latent Key/confirmation #/[id number] Status is pending

## 2024-08-03 ENCOUNTER — Other Ambulatory Visit (HOSPITAL_BASED_OUTPATIENT_CLINIC_OR_DEPARTMENT_OTHER): Payer: Self-pay

## 2024-08-03 MED ORDER — AMPHETAMINE-DEXTROAMPHET ER 20 MG PO CP24
20.0000 mg | ORAL_CAPSULE | Freq: Every morning | ORAL | 0 refills | Status: DC
  Filled 2024-08-08: qty 30, 30d supply, fill #0

## 2024-08-04 ENCOUNTER — Other Ambulatory Visit (HOSPITAL_BASED_OUTPATIENT_CLINIC_OR_DEPARTMENT_OTHER): Payer: Self-pay

## 2024-08-09 ENCOUNTER — Other Ambulatory Visit: Payer: Self-pay

## 2024-08-09 ENCOUNTER — Other Ambulatory Visit (HOSPITAL_BASED_OUTPATIENT_CLINIC_OR_DEPARTMENT_OTHER): Payer: Self-pay

## 2024-08-10 ENCOUNTER — Other Ambulatory Visit (HOSPITAL_BASED_OUTPATIENT_CLINIC_OR_DEPARTMENT_OTHER): Payer: Self-pay

## 2024-08-11 ENCOUNTER — Other Ambulatory Visit (HOSPITAL_BASED_OUTPATIENT_CLINIC_OR_DEPARTMENT_OTHER): Payer: Self-pay

## 2024-08-11 MED ORDER — LISDEXAMFETAMINE DIMESYLATE 30 MG PO CAPS
30.0000 mg | ORAL_CAPSULE | Freq: Every morning | ORAL | 0 refills | Status: AC
  Filled 2024-08-11 (×2): qty 30, 30d supply, fill #0

## 2024-08-12 ENCOUNTER — Other Ambulatory Visit (HOSPITAL_BASED_OUTPATIENT_CLINIC_OR_DEPARTMENT_OTHER): Payer: Self-pay

## 2024-08-13 ENCOUNTER — Other Ambulatory Visit (HOSPITAL_BASED_OUTPATIENT_CLINIC_OR_DEPARTMENT_OTHER): Payer: Self-pay

## 2024-08-16 ENCOUNTER — Other Ambulatory Visit: Payer: Self-pay

## 2024-08-18 ENCOUNTER — Other Ambulatory Visit: Payer: Self-pay

## 2024-08-18 ENCOUNTER — Other Ambulatory Visit (HOSPITAL_BASED_OUTPATIENT_CLINIC_OR_DEPARTMENT_OTHER): Payer: Self-pay

## 2024-08-18 MED ORDER — FLUOXETINE HCL 20 MG PO CAPS
20.0000 mg | ORAL_CAPSULE | Freq: Every morning | ORAL | 3 refills | Status: AC
  Filled 2024-08-18: qty 30, 30d supply, fill #0

## 2024-08-18 MED ORDER — LISDEXAMFETAMINE DIMESYLATE 10 MG PO CAPS
10.0000 mg | ORAL_CAPSULE | Freq: Every morning | ORAL | 0 refills | Status: AC
  Filled 2024-08-18: qty 21, 21d supply, fill #0

## 2024-08-31 ENCOUNTER — Other Ambulatory Visit (HOSPITAL_BASED_OUTPATIENT_CLINIC_OR_DEPARTMENT_OTHER): Payer: Self-pay

## 2024-08-31 MED ORDER — LISDEXAMFETAMINE DIMESYLATE 50 MG PO CAPS
50.0000 mg | ORAL_CAPSULE | Freq: Every morning | ORAL | 0 refills | Status: AC
  Filled 2024-08-31: qty 30, 30d supply, fill #0

## 2024-09-01 ENCOUNTER — Other Ambulatory Visit (HOSPITAL_BASED_OUTPATIENT_CLINIC_OR_DEPARTMENT_OTHER): Payer: Self-pay
